# Patient Record
Sex: Male | Born: 1955 | Hispanic: Yes | Marital: Married | Smoking: Former smoker
Health system: Southern US, Community
[De-identification: ages and names within clinical notes are randomized; demographics above are authoritative.]

## PROBLEM LIST (undated history)

## (undated) DIAGNOSIS — H9319 Tinnitus, unspecified ear: Secondary | ICD-10-CM

## (undated) DIAGNOSIS — M109 Gout, unspecified: Secondary | ICD-10-CM

## (undated) DIAGNOSIS — R972 Elevated prostate specific antigen [PSA]: Secondary | ICD-10-CM

## (undated) DIAGNOSIS — E119 Type 2 diabetes mellitus without complications: Secondary | ICD-10-CM

## (undated) HISTORY — DX: Type 2 diabetes mellitus without complications: E11.9

## (undated) HISTORY — DX: Tinnitus, unspecified ear: H93.19

## (undated) HISTORY — DX: Elevated prostate specific antigen (PSA): R97.20

## (undated) HISTORY — DX: Gout, unspecified: M10.9

---

## 2005-08-19 ENCOUNTER — Ambulatory Visit: Admission: RE | Admit: 2005-08-19 | Discharge: 2005-08-19 | Payer: Self-pay | Admitting: Otolaryngology

## 2005-09-10 ENCOUNTER — Emergency Department (HOSPITAL_COMMUNITY): Admission: EM | Admit: 2005-09-10 | Discharge: 2005-09-10 | Payer: Self-pay | Admitting: Emergency Medicine

## 2011-07-17 ENCOUNTER — Encounter: Payer: Self-pay | Admitting: Internal Medicine

## 2011-07-17 ENCOUNTER — Ambulatory Visit (INDEPENDENT_AMBULATORY_CARE_PROVIDER_SITE_OTHER): Payer: Self-pay | Admitting: Internal Medicine

## 2011-07-17 DIAGNOSIS — R300 Dysuria: Secondary | ICD-10-CM

## 2011-07-17 DIAGNOSIS — Z0000 Encounter for general adult medical examination without abnormal findings: Secondary | ICD-10-CM | POA: Insufficient documentation

## 2011-07-17 DIAGNOSIS — Z136 Encounter for screening for cardiovascular disorders: Secondary | ICD-10-CM

## 2011-07-17 DIAGNOSIS — Z23 Encounter for immunization: Secondary | ICD-10-CM

## 2011-07-17 LAB — POCT URINALYSIS DIPSTICK
Bilirubin, UA: NEGATIVE
Glucose, UA: NEGATIVE
Leukocytes, UA: NEGATIVE
Nitrite, UA: NEGATIVE
Protein, UA: NEGATIVE
Spec Grav, UA: 1.03
Urobilinogen, UA: 0.2

## 2011-07-17 LAB — LDL CHOLESTEROL, DIRECT: Direct LDL: 156.7 mg/dL

## 2011-07-17 LAB — COMPREHENSIVE METABOLIC PANEL
ALT: 19 U/L (ref 0–53)
AST: 25 U/L (ref 0–37)
Albumin: 4.2 g/dL (ref 3.5–5.2)
Alkaline Phosphatase: 68 U/L (ref 39–117)
CO2: 30 mEq/L (ref 19–32)
Calcium: 9.2 mg/dL (ref 8.4–10.5)
Chloride: 105 mEq/L (ref 96–112)
Creatinine, Ser: 0.9 mg/dL (ref 0.4–1.5)
Glucose, Bld: 106 mg/dL — ABNORMAL HIGH (ref 70–99)
Potassium: 4.8 mEq/L (ref 3.5–5.1)
Sodium: 141 mEq/L (ref 135–145)
Total Bilirubin: 0.7 mg/dL (ref 0.3–1.2)

## 2011-07-17 LAB — TSH: TSH: 2.24 u[IU]/mL (ref 0.35–5.50)

## 2011-07-17 LAB — CBC WITH DIFFERENTIAL/PLATELET
Basophils Absolute: 0 10*3/uL (ref 0.0–0.1)
Basophils Relative: 0.2 % (ref 0.0–3.0)
Eosinophils Absolute: 0 10*3/uL (ref 0.0–0.7)
Eosinophils Relative: 0.3 % (ref 0.0–5.0)
HCT: 42.9 % (ref 39.0–52.0)
Hemoglobin: 14.1 g/dL (ref 13.0–17.0)
Lymphocytes Relative: 36.6 % (ref 12.0–46.0)
Lymphs Abs: 2.7 10*3/uL (ref 0.7–4.0)
MCHC: 33 g/dL (ref 30.0–36.0)
MCV: 88.2 fl (ref 78.0–100.0)
Monocytes Relative: 7.1 % (ref 3.0–12.0)
Neutro Abs: 4.1 10*3/uL (ref 1.4–7.7)
Neutrophils Relative %: 55.8 % (ref 43.0–77.0)
RDW: 13.9 % (ref 11.5–14.6)

## 2011-07-17 LAB — LIPID PANEL
Cholesterol: 228 mg/dL — ABNORMAL HIGH (ref 0–200)
HDL: 48.5 mg/dL (ref >39.00)
Total CHOL/HDL Ratio: 5
VLDL: 24.2 mg/dL (ref 0.0–40.0)

## 2011-07-17 LAB — PSA: PSA: 2.61 ng/mL (ref 0.10–4.00)

## 2011-07-17 NOTE — Progress Notes (Signed)
  Subjective:    Patient ID: Christopher Guerra, male    DOB: 09-07-1956, 55 y.o.   MRN: [id number]  HPI CPX , new patient X a while has occasional suprapubic pressure and sporadic dysuria No hematuria or nocturia  Past Medical History  Diagnosis Date  . No active medical problems    Past Surgical History  Procedure Date  . No past surgeries    History   Social History  . Marital Status: Unknown    Spouse Name: N/A    Number of Children: 3  . Years of Education: N/A   Occupational History  . works in a Science writer    Social History Main Topics  . Smoking status: Former Games developer  . Smokeless tobacco: Never Used  . Alcohol Use: Yes     socially   . Drug Use: No  . Sexually Active: Not on file   Other Topics Concern  . Not on file   Social History Narrative   Original from [place], 3 daughters, wife has an additional  child    Family History  Problem Relation Age of Onset  . Diabetes      M late onset  . Coronary artery disease Neg Hx   . Stroke Neg Hx   . Prostate cancer Neg Hx   . Colon cancer Neg Hx      Review of Systems No CP-SOB No N-V-D or blood in stools  No anxiety-depression    Objective:   Physical Exam  Constitutional: He is oriented to person, place, and time. He appears well-developed and well-nourished. No distress.  HENT:  Head: Normocephalic and atraumatic.  Neck: No thyromegaly present.       Nl carotid pulse   Cardiovascular: Normal rate, regular rhythm and normal heart sounds.   No murmur heard. Pulmonary/Chest: Effort normal and breath sounds normal. No respiratory distress. He has no wheezes. He has no rales.  Abdominal: Soft. Bowel sounds are normal. He exhibits no distension and no mass. There is no rebound and no guarding.       Mild suprapubic dyscomfort w/ palpation  Genitourinary: Rectum normal and prostate normal. Guaiac negative stool.  Musculoskeletal: He exhibits no edema.  Neurological: He is alert and oriented to  person, place, and time.  Skin: Skin is warm and dry. He is not diaphoretic.  Psychiatric: He has a normal mood and affect. His behavior is normal. Judgment and thought content normal.          Assessment & Plan:

## 2011-07-17 NOTE — Assessment & Plan Note (Signed)
Td 2002, again today Never had a Cscope, benefits discussed, will think about it, iFOB provided Diet-exercise discussed EKG Labs  Some urinary sx, DRE neg: will check a PSA-UA-UCX

## 2011-07-19 LAB — CULTURE, URINE COMPREHENSIVE
Colony Count: NO GROWTH
Organism ID, Bacteria: NO GROWTH

## 2011-07-21 ENCOUNTER — Encounter: Payer: Self-pay | Admitting: Internal Medicine

## 2011-07-29 ENCOUNTER — Encounter: Payer: Self-pay | Admitting: *Deleted

## 2011-07-31 ENCOUNTER — Other Ambulatory Visit: Payer: Self-pay

## 2011-08-01 ENCOUNTER — Other Ambulatory Visit: Payer: Self-pay | Admitting: Internal Medicine

## 2011-08-01 DIAGNOSIS — Z0000 Encounter for general adult medical examination without abnormal findings: Secondary | ICD-10-CM

## 2011-08-04 NOTE — Progress Notes (Signed)
Quick Note:  Printed and mailed ______

## 2011-08-15 ENCOUNTER — Telehealth: Payer: Self-pay | Admitting: Internal Medicine

## 2011-08-15 NOTE — Telephone Encounter (Signed)
Results were mailed on 08/04/11. Will mail them again

## 2012-10-04 ENCOUNTER — Ambulatory Visit (INDEPENDENT_AMBULATORY_CARE_PROVIDER_SITE_OTHER): Payer: Self-pay | Admitting: Internal Medicine

## 2012-10-04 ENCOUNTER — Encounter: Payer: Self-pay | Admitting: Internal Medicine

## 2012-10-04 VITALS — BP 128/70 | HR 64 | Temp 97.9°F | Ht 63.25 in | Wt 164.0 lb

## 2012-10-04 DIAGNOSIS — Z0000 Encounter for general adult medical examination without abnormal findings: Secondary | ICD-10-CM

## 2012-10-04 DIAGNOSIS — R7309 Other abnormal glucose: Secondary | ICD-10-CM

## 2012-10-04 DIAGNOSIS — R739 Hyperglycemia, unspecified: Secondary | ICD-10-CM

## 2012-10-04 DIAGNOSIS — M109 Gout, unspecified: Secondary | ICD-10-CM | POA: Insufficient documentation

## 2012-10-04 LAB — CBC WITH DIFFERENTIAL/PLATELET
Basophils Absolute: 0 10*3/uL (ref 0.0–0.1)
Basophils Relative: 0.3 % (ref 0.0–3.0)
Eosinophils Absolute: 0 10*3/uL (ref 0.0–0.7)
Eosinophils Relative: 0.1 % (ref 0.0–5.0)
HCT: 42.1 % (ref 39.0–52.0)
Hemoglobin: 14.1 g/dL (ref 13.0–17.0)
Lymphocytes Relative: 21.6 % (ref 12.0–46.0)
Lymphs Abs: 2.3 10*3/uL (ref 0.7–4.0)
MCHC: 33.4 g/dL (ref 30.0–36.0)
MCV: 87.2 fl (ref 78.0–100.0)
Monocytes Absolute: 0.6 10*3/uL (ref 0.1–1.0)
Monocytes Relative: 6 % (ref 3.0–12.0)
Neutro Abs: 7.7 10*3/uL (ref 1.4–7.7)
Neutrophils Relative %: 72 % (ref 43.0–77.0)
Platelets: 256 10*3/uL (ref 150.0–400.0)
RBC: 4.83 Mil/uL (ref 4.22–5.81)
WBC: 10.7 10*3/uL — ABNORMAL HIGH (ref 4.5–10.5)

## 2012-10-04 LAB — POCT URINALYSIS DIPSTICK
Ketones, UA: NEGATIVE
Leukocytes, UA: NEGATIVE
Nitrite, UA: NEGATIVE
Spec Grav, UA: 1.01
Urobilinogen, UA: 0.2
pH, UA: 6

## 2012-10-04 LAB — COMPREHENSIVE METABOLIC PANEL
ALT: 24 U/L (ref 0–53)
AST: 23 U/L (ref 0–37)
Alkaline Phosphatase: 81 U/L (ref 39–117)
BUN: 15 mg/dL (ref 6–23)
CO2: 28 mEq/L (ref 19–32)
Calcium: 9.3 mg/dL (ref 8.4–10.5)
Creatinine, Ser: 1 mg/dL (ref 0.4–1.5)
GFR: 83.81 mL/min (ref >60.00)
Glucose, Bld: 108 mg/dL — ABNORMAL HIGH (ref 70–99)
Potassium: 4.4 mEq/L (ref 3.5–5.1)
Sodium: 137 mEq/L (ref 135–145)
Total Bilirubin: 0.7 mg/dL (ref 0.3–1.2)
Total Protein: 7.4 g/dL (ref 6.0–8.3)

## 2012-10-04 LAB — LIPID PANEL
HDL: 50.4 mg/dL (ref >39.00)
Total CHOL/HDL Ratio: 5
VLDL: 21.6 mg/dL (ref 0.0–40.0)

## 2012-10-04 LAB — HEMOGLOBIN A1C: Hgb A1c MFr Bld: 6.5 % (ref 4.6–6.5)

## 2012-10-04 LAB — PSA: PSA: 2.8 ng/mL (ref 0.10–4.00)

## 2012-10-04 LAB — URIC ACID: Uric Acid, Serum: 7.8 mg/dL (ref 4.0–7.8)

## 2012-10-04 LAB — LDL CHOLESTEROL, DIRECT: Direct LDL: 186 mg/dL

## 2012-10-04 NOTE — Progress Notes (Signed)
  Subjective:    Patient ID: Christopher Guerra, male    DOB: 01/20/1956, 56 y.o.   MRN: [id number]  HPI Complete physical exam, in addition we discussed the following: DOT exam On and off pain and swelling in the left or right ankle and dorsum of his foot. Never been diagnosed with gout but he suspects that may be the issue. Current symptoms are in the left ankle, he took some anti-inflammatories yesterday with some relief  Past Medical History  Diagnosis Date  . Gout     ? of gout  . Gout ? 10/04/2012   Past Surgical History  Procedure Date  . No past surgeries    History   Social History  . Marital Status: Married    Spouse Name: N/A    Number of Children: 3  . Years of Education: N/A   Occupational History  . works in a Science writer    Social History Main Topics  . Smoking status: Former Games developer  . Smokeless tobacco: Never Used  . Alcohol Use: Yes     Comment: socially   . Drug Use: No  . Sexually Active: Not on file   Other Topics Concern  . Not on file   Social History Narrative   Original from [place], 3 daughters, wife has an additional child, 2 adopted children ---Lives w/ wife   Family History  Problem Relation Age of Onset  . Diabetes      M late onset  . Coronary artery disease Neg Hx   . Stroke Neg Hx   . Prostate cancer Neg Hx   . Colon cancer Neg Hx     Review of Systems No chest pain, shortness of breath No nausea, vomiting, diarrhea or blood in the stools Occasional dysuria but no difficulty urinating or blood in the urine.     Objective:   Physical Exam General -- alert, well-developed  Neck --no thyromegaly  HEENT -- EOMI, PERRLA Lungs -- normal respiratory effort, no intercostal retractions, no accessory muscle use, and normal breath sounds.   Heart-- normal rate, regular rhythm, no murmur, and no gallop.   Abdomen--soft, non-tender, no distention, no masses, no HSM, no guarding, and no rigidity.   Extremities-- no pretibial edema  bilaterally; right ankle/foot normal, left ankle slightly warm, no deformities or redness, it is full range of motion. Rectal-- No external abnormalities noted. Normal sphincter tone. No rectal masses or tenderness. No stools found Prostate:  Prostate gland firm and smooth, no enlargement, nodularity, tenderness, mass, asymmetry or induration. Neurologic-- alert & oriented X3 , DTRs and strength normal in all extremities. Psych-- Cognition and judgment appear intact. Alert and cooperative with normal attention span and concentration.  not anxious appearing and not depressed appearing.      Assessment & Plan:  In addition to the CPX, we spent more than 15 minutes assisting a new problem (gout)

## 2012-10-04 NOTE — Assessment & Plan Note (Addendum)
Td 2012 Flu shot --declined  Never had a Cscope, benefits discussed,agree to be referred to GI Labs  Some urinary sx today and also last year (dysuria):  last year  PSA-UA-UCX normal. Today Udip showed trace blood, DRE is normal, we'll recheck a UA and urine culture. Consider urology referral   DOT form completed except for vision, he was 20/50 right, left and both thus needs to see the eye doctor; will hold on to the form, he will bring additional paperwork for me complete (the small form to sign) Also, blood sugar last year was a slightly elevated, check A1c.

## 2012-10-04 NOTE — Assessment & Plan Note (Signed)
Symptoms consistent with gout, labs. Further advice with results.

## 2012-10-05 LAB — URINALYSIS, MICROSCOPIC ONLY
Bacteria, UA: NONE SEEN
Casts: NONE SEEN
Squamous Epithelial / HPF: NONE SEEN

## 2012-10-06 LAB — URINE CULTURE
Colony Count: NO GROWTH
Organism ID, Bacteria: NO GROWTH

## 2012-10-07 ENCOUNTER — Encounter: Payer: Self-pay | Admitting: Internal Medicine

## 2012-10-07 ENCOUNTER — Telehealth: Payer: Self-pay | Admitting: Internal Medicine

## 2012-10-07 MED ORDER — COLCHICINE 0.6 MG PO TABS: 0.6000 mg | ORAL_TABLET | Freq: Two times a day (BID) | ORAL | Status: DC | PRN

## 2012-10-07 NOTE — Telephone Encounter (Signed)
Blood sugar and cholesterol moderately elevated. Other labs normal including a normal uric acid. I spoke with the patient, recommend a healthier diet, stay active and lose some weight.  Despite a normal uric acid, his left lower extremity pain still could be gout consequently, will call colcrys 0.6 twice a day #60, 1 refill to be taken when necessary. If he is not improving he needs to let me know. Needs office visit in 3 months. Patient verbalized understanding

## 2012-10-21 ENCOUNTER — Telehealth: Payer: Self-pay | Admitting: *Deleted

## 2012-10-21 NOTE — Telephone Encounter (Signed)
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

## 2012-10-29 ENCOUNTER — Ambulatory Visit (AMBULATORY_SURGERY_CENTER): Payer: Self-pay | Admitting: *Deleted

## 2012-10-29 VITALS — Ht 62.0 in | Wt 165.2 lb

## 2012-10-29 DIAGNOSIS — Z1211 Encounter for screening for malignant neoplasm of colon: Secondary | ICD-10-CM

## 2012-10-29 MED ORDER — MOVIPREP 100 G PO SOLR: 1.0000 | Freq: Once | ORAL | Status: DC

## 2012-10-29 NOTE — Progress Notes (Signed)
Free sample kit of suprep given to patient for prep for colonoscopy scheduled 11/10/12

## 2012-11-10 ENCOUNTER — Ambulatory Visit (AMBULATORY_SURGERY_CENTER): Payer: Self-pay | Admitting: Internal Medicine

## 2012-11-10 ENCOUNTER — Encounter: Payer: Self-pay | Admitting: Internal Medicine

## 2012-11-10 VITALS — BP 114/77 | HR 58 | Temp 97.3°F | Resp 17 | Ht 62.0 in | Wt 165.0 lb

## 2012-11-10 DIAGNOSIS — K5730 Diverticulosis of large intestine without perforation or abscess without bleeding: Secondary | ICD-10-CM

## 2012-11-10 DIAGNOSIS — Z1211 Encounter for screening for malignant neoplasm of colon: Secondary | ICD-10-CM

## 2012-11-10 MED ORDER — SODIUM CHLORIDE 0.9 % IV SOLN: 500.0000 mL | INTRAVENOUS | Status: DC

## 2012-11-10 NOTE — Progress Notes (Signed)
Patient did not experience any of the following events: a burn prior to discharge; a fall within the facility; wrong site/side/patient/procedure/implant event; or a hospital transfer or hospital admission upon discharge from the facility. (G8907) Patient did not have preoperative order for IV antibiotic SSI prophylaxis. (G8918)  

## 2012-11-10 NOTE — Patient Instructions (Addendum)
No polyps or cancer seen! You do have diverticulosis - a common condition that is not usually a problem. Please read the handout provided. Next routine colonoscopy in 10 years (2024).  Thank you for choosing me and [facility].  Iva Boop, MD, FACG    YOU HAD AN ENDOSCOPIC PROCEDURE TODAY AT THE [facility]: Refer to the procedure report that was given to you for any specific questions about what was found during the examination.  If the procedure report does not answer your questions, please call your gastroenterologist to clarify.  If you requested that your care partner not be given the details of your procedure findings, then the procedure report has been included in a sealed envelope for you to review at your convenience later.  YOU SHOULD EXPECT: Some feelings of bloating in the abdomen. Passage of more gas than usual.  Walking can help get rid of the air that was put into your GI tract during the procedure and reduce the bloating. If you had a lower endoscopy (such as a colonoscopy or flexible sigmoidoscopy) you may notice spotting of blood in your stool or on the toilet paper. If you underwent a bowel prep for your procedure, then you may not have a normal bowel movement for a few days.  DIET: Your first meal following the procedure should be a light meal and then it is ok to progress to your normal diet.  A half-sandwich or bowl of soup is an example of a good first meal.  Heavy or fried foods are harder to digest and may make you feel nauseous or bloated.  Likewise meals heavy in dairy and vegetables can cause extra gas to form and this can also increase the bloating.  Drink plenty of fluids but you should avoid alcoholic beverages for 24 hours.  ACTIVITY: Your care partner should take you home directly after the procedure.  You should plan to take it easy, moving slowly for the rest of the day.  You can resume normal activity the day after the procedure  however you should NOT DRIVE or use heavy machinery for 24 hours (because of the sedation medicines used during the test).    SYMPTOMS TO REPORT IMMEDIATELY: A gastroenterologist can be reached at any hour.  During normal business hours, 8:30 AM to 5:00 PM Monday through Friday, call [phone].  After hours and on weekends, please call the GI answering service at [phone] who will take a message and have the physician on call contact you.   Following lower endoscopy (colonoscopy or flexible sigmoidoscopy):  Excessive amounts of blood in the stool  Significant tenderness or worsening of abdominal pains  Swelling of the abdomen that is new, acute  Fever of 100F or higher   FOLLOW UP: If any biopsies were taken you will be contacted by phone or by letter within the next 1-3 weeks.  Call your gastroenterologist if you have not heard about the biopsies in 3 weeks.  Our staff will call the home number listed on your records the next business day following your procedure to check on you and address any questions or concerns that you may have at that time regarding the information given to you following your procedure. This is a courtesy call and so if there is no answer at the home number and we have not heard from you through the emergency physician on call, we will assume that you have returned to your regular daily activities without incident.  SIGNATURES/CONFIDENTIALITY: You and/or your care partner have signed paperwork which will be entered into your electronic medical record.  These signatures attest to the fact that that the information above on your After Visit Summary has been reviewed and is understood.  Full responsibility of the confidentiality of this discharge information lies with you and/or your care-partner.    Diverticulosis information given.  Next colonoscopy 10 years-2014.

## 2012-11-10 NOTE — Op Note (Signed)
[facility] [address] [place], [address]   COLONOSCOPY PROCEDURE REPORT  PATIENT: Christopher Guerra, Christopher Guerra  MR#: [id number] BIRTHDATE: 14-Apr-1956 , 57  yrs. old GENDER: Male ENDOSCOPIST: Iva Boop, MD, [facility] REFERRED [id number] Drue Novel, M.D. PROCEDURE DATE:  11/10/2012 PROCEDURE:   Colonoscopy, diagnostic ASA CLASS:   Class II INDICATIONS:average risk screening. MEDICATIONS: propofol (Diprivan) 200mg  IV, MAC sedation, administered by CRNA, and These medications were titrated to patient response per physician's verbal order  DESCRIPTION OF PROCEDURE:   After the risks benefits and alternatives of the procedure were thoroughly explained, informed consent was obtained.  A digital rectal exam revealed no abnormalities of the rectum, A digital rectal exam revealed the prostate was not enlarged, and A digital rectal exam revealed no prostatic nodules.   The LB [id number]  endoscope was introduced through the anus and advanced to the cecum, which was identified by both the appendix and ileocecal valve. IC valve photo taken but blurred so discarded. No adverse events experienced. The quality of the prep was excellent, using MoviPrep  The instrument was then slowly withdrawn as the colon was fully examined.      COLON FINDINGS: Mild diverticulosis was noted The finding was in the right colon.   The colon mucosa was otherwise normal.  Retroflexed views revealed no abnormalities. The time to cecum=1 minutes 16 seconds.  Withdrawal time=7 minutes 0 seconds.  The scope was withdrawn and the procedure completed. COMPLICATIONS: There were no complications.  ENDOSCOPIC IMPRESSION: 1.   Mild diverticulosis was noted in the right colon 2.   The colon mucosa was otherwise normal w/ excellent prep  RECOMMENDATIONS: Repeat colonoscopy 10 years - 2024   eSigned:  Iva Boop, MD, [facility] 11/10/2012 11:17 AM   cc: Willow Ora, MD and The Patient

## 2012-11-11 ENCOUNTER — Telehealth: Payer: Self-pay | Admitting: *Deleted

## 2012-11-11 NOTE — Telephone Encounter (Signed)
  Follow up Call-  Call back number 11/10/2012  Post procedure Call Back phone  # [phone]  Permission to leave phone message Yes     Patient questions:  Do you have a fever, pain , or abdominal swelling? no Pain Score  0 *  Have you tolerated food without any problems? yes  Have you been able to return to your normal activities? yes  Do you have any questions about your discharge instructions: Diet   no Medications  no Follow up visit  no  Do you have questions or concerns about your Care? no  Actions: * If pain score is 4 or above: No action needed, pain <4.

## 2012-11-27 ENCOUNTER — Other Ambulatory Visit: Payer: Self-pay

## 2013-01-05 ENCOUNTER — Ambulatory Visit: Payer: Self-pay | Admitting: Internal Medicine

## 2013-02-01 ENCOUNTER — Ambulatory Visit: Payer: Self-pay | Admitting: Internal Medicine

## 2013-02-01 DIAGNOSIS — Z0289 Encounter for other administrative examinations: Secondary | ICD-10-CM

## 2013-08-18 ENCOUNTER — Other Ambulatory Visit: Payer: Self-pay

## 2013-10-13 HISTORY — PX: CATARACT EXTRACTION: SUR2

## 2015-01-14 ENCOUNTER — Other Ambulatory Visit: Payer: Self-pay

## 2015-01-14 ENCOUNTER — Emergency Department (HOSPITAL_COMMUNITY): Payer: 59

## 2015-01-14 ENCOUNTER — Emergency Department (HOSPITAL_COMMUNITY)
Admission: EM | Admit: 2015-01-14 | Discharge: 2015-01-14 | Disposition: A | Discharge status: Home / Self Care | Payer: 59 | Attending: Emergency Medicine | Admitting: Emergency Medicine

## 2015-01-14 ENCOUNTER — Encounter (HOSPITAL_COMMUNITY): Payer: Self-pay

## 2015-01-14 DIAGNOSIS — Z87891 Personal history of nicotine dependence: Secondary | ICD-10-CM | POA: Insufficient documentation

## 2015-01-14 DIAGNOSIS — M545 Low back pain, unspecified: Secondary | ICD-10-CM

## 2015-01-14 DIAGNOSIS — M109 Gout, unspecified: Secondary | ICD-10-CM | POA: Diagnosis not present

## 2015-01-14 DIAGNOSIS — R1032 Left lower quadrant pain: Secondary | ICD-10-CM | POA: Insufficient documentation

## 2015-01-14 DIAGNOSIS — R21 Rash and other nonspecific skin eruption: Secondary | ICD-10-CM | POA: Insufficient documentation

## 2015-01-14 DIAGNOSIS — R52 Pain, unspecified: Secondary | ICD-10-CM

## 2015-01-14 DIAGNOSIS — Z791 Long term (current) use of non-steroidal anti-inflammatories (NSAID): Secondary | ICD-10-CM | POA: Insufficient documentation

## 2015-01-14 LAB — URINALYSIS, ROUTINE W REFLEX MICROSCOPIC
BILIRUBIN URINE: NEGATIVE
Glucose, UA: NEGATIVE mg/dL
Hgb urine dipstick: NEGATIVE
KETONES UR: NEGATIVE mg/dL
LEUKOCYTES UA: NEGATIVE
Nitrite: NEGATIVE
PH: 7 (ref 5.0–8.0)
Protein, ur: NEGATIVE mg/dL
Specific Gravity, Urine: 1.016 (ref 1.005–1.030)
UROBILINOGEN UA: 0.2 mg/dL (ref 0.0–1.0)

## 2015-01-14 LAB — BASIC METABOLIC PANEL
Anion gap: 11 (ref 5–15)
BUN: 9 mg/dL (ref 6–23)
CO2: 26 mmol/L (ref 19–32)
Calcium: 9.1 mg/dL (ref 8.4–10.5)
Chloride: 100 mmol/L (ref 96–112)
Creatinine, Ser: 0.95 mg/dL (ref 0.50–1.35)
GFR calc Af Amer: >90 mL/min (ref >90)
GFR, EST NON AFRICAN AMERICAN: 89 mL/min — AB (ref >90)
Glucose, Bld: 134 mg/dL — ABNORMAL HIGH (ref 70–99)
Potassium: 4.1 mmol/L (ref 3.5–5.1)
Sodium: 137 mmol/L (ref 135–145)

## 2015-01-14 LAB — CBC WITH DIFFERENTIAL/PLATELET
Basophils Absolute: 0 10*3/uL (ref 0.0–0.1)
Basophils Relative: 0 % (ref 0–1)
EOS PCT: 2 % (ref 0–5)
Eosinophils Absolute: 0.2 10*3/uL (ref 0.0–0.7)
HCT: 44.4 % (ref 39.0–52.0)
Hemoglobin: 14.9 g/dL (ref 13.0–17.0)
LYMPHS ABS: 3.3 10*3/uL (ref 0.7–4.0)
LYMPHS PCT: 29 % (ref 12–46)
MCH: 28.9 pg (ref 26.0–34.0)
MCHC: 33.6 g/dL (ref 30.0–36.0)
MCV: 86.2 fL (ref 78.0–100.0)
MONOS PCT: 6 % (ref 3–12)
Monocytes Absolute: 0.7 10*3/uL (ref 0.1–1.0)
Neutro Abs: 7 10*3/uL (ref 1.7–7.7)
Neutrophils Relative %: 63 % (ref 43–77)
Platelets: 230 10*3/uL (ref 150–400)
RBC: 5.15 MIL/uL (ref 4.22–5.81)
RDW: 14.4 % (ref 11.5–15.5)
WBC: 11.2 10*3/uL — ABNORMAL HIGH (ref 4.0–10.5)

## 2015-01-14 LAB — TROPONIN I

## 2015-01-14 MED ORDER — SODIUM CHLORIDE 0.9 % IV SOLN
INTRAVENOUS | Status: DC
  Administered 2015-01-14: 20 mL/h via INTRAVENOUS

## 2015-01-14 MED ORDER — HYDROMORPHONE HCL 1 MG/ML IJ SOLN
1.0000 mg | Freq: Once | INTRAMUSCULAR | Status: AC
  Administered 2015-01-14: 1 mg via INTRAVENOUS
  Filled 2015-01-14: qty 1

## 2015-01-14 MED ORDER — LORAZEPAM 2 MG/ML IJ SOLN
1.0000 mg | Freq: Once | INTRAMUSCULAR | Status: AC
  Administered 2015-01-14: 1 mg via INTRAVENOUS
  Filled 2015-01-14: qty 1

## 2015-01-14 MED ORDER — OXYCODONE-ACETAMINOPHEN 5-325 MG PO TABS: 1.0000 | ORAL_TABLET | ORAL | Status: DC | PRN

## 2015-01-14 MED ORDER — DIAZEPAM 2 MG PO TABS: 2.0000 mg | ORAL_TABLET | ORAL | Status: DC | PRN

## 2015-01-14 MED ORDER — ONDANSETRON HCL 4 MG/2ML IJ SOLN
4.0000 mg | Freq: Once | INTRAMUSCULAR | Status: AC
  Administered 2015-01-14: 4 mg via INTRAVENOUS
  Filled 2015-01-14: qty 2

## 2015-01-14 MED ORDER — KETOROLAC TROMETHAMINE 30 MG/ML IJ SOLN
30.0000 mg | Freq: Once | INTRAMUSCULAR | Status: AC
  Administered 2015-01-14: 30 mg via INTRAVENOUS
  Filled 2015-01-14: qty 1

## 2015-01-14 MED ORDER — ONDANSETRON 4 MG PO TBDP
4.0000 mg | ORAL_TABLET | Freq: Once | ORAL | Status: AC
  Administered 2015-01-14: 4 mg via ORAL
  Filled 2015-01-14: qty 1

## 2015-01-14 NOTE — Discharge Instructions (Signed)
Back Pain, Adult Low back pain is very common. About 1 in 5 people have back pain.The cause of low back pain is rarely dangerous. The pain often gets better over time.About half of people with a sudden onset of back pain feel better in just 2 weeks. About 8 in 10 people feel better by 6 weeks.  CAUSES Some common causes of back pain include:  Strain of the muscles or ligaments supporting the spine.  Wear and tear (degeneration) of the spinal discs.  Arthritis.  Direct injury to the back. DIAGNOSIS Most of the time, the direct cause of low back pain is not known.However, back pain can be treated effectively even when the exact cause of the pain is unknown.Answering your caregiver's questions about your overall health and symptoms is one of the most accurate ways to make sure the cause of your pain is not dangerous. If your caregiver needs more information, he or she may order lab work or imaging tests (X-rays or MRIs).However, even if imaging tests show changes in your back, this usually does not require surgery. HOME CARE INSTRUCTIONS For many people, back pain returns.Since low back pain is rarely dangerous, it is often a condition that people can learn to manageon their own.   Remain active. It is stressful on the back to sit or stand in one place. Do not sit, drive, or stand in one place for more than 30 minutes at a time. Take short walks on level surfaces as soon as pain allows.Try to increase the length of time you walk each day.  Do not stay in bed.Resting more than 1 or 2 days can delay your recovery.  Do not avoid exercise or work.Your body is made to move.It is not dangerous to be active, even though your back may hurt.Your back will likely heal faster if you return to being active before your pain is gone.  Pay attention to your body when you bend and lift. Many people have less discomfortwhen lifting if they bend their knees, keep the load close to their bodies,and  avoid twisting. Often, the most comfortable positions are those that put less stress on your recovering back.  Find a comfortable position to sleep. Use a firm mattress and lie on your side with your knees slightly bent. If you lie on your back, put a pillow under your knees.  Only take over-the-counter or prescription medicines as directed by your caregiver. Over-the-counter medicines to reduce pain and inflammation are often the most helpful.Your caregiver may prescribe muscle relaxant drugs.These medicines help dull your pain so you can more quickly return to your normal activities and healthy exercise.  Put ice on the injured area.  Put ice in a plastic bag.  Place a towel between your skin and the bag.  Leave the ice on for 15-20 minutes, 03-04 times a day for the first 2 to 3 days. After that, ice and heat may be alternated to reduce pain and spasms.  Ask your caregiver about trying back exercises and gentle massage. This may be of some benefit.  Avoid feeling anxious or stressed.Stress increases muscle tension and can worsen back pain.It is important to recognize when you are anxious or stressed and learn ways to manage it.Exercise is a great option. SEEK MEDICAL CARE IF:  You have pain that is not relieved with rest or medicine.  You have pain that does not improve in 1 week.  You have new symptoms.  You are generally not feeling well. SEEK   IMMEDIATE MEDICAL CARE IF:   You have pain that radiates from your back into your legs.  You develop new bowel or bladder control problems.  You have unusual weakness or numbness in your arms or legs.  You develop nausea or vomiting.  You develop abdominal pain.  You feel faint. Document Released: 09/29/2005 Document Revised: 03/30/2012 Document Reviewed: 01/31/2014 ExitCare Patient Information 2015 [employer]. This information is not intended to replace advice given to you by your health care provider. Make sure you  discuss any questions you have with your health care provider.  

## 2015-01-14 NOTE — ED Notes (Signed)
Pt reports left sided back pain that radiates to chest. Described as "cramping." Denies SOb, diaphoresis, N/V. Pt also reports itching red rash x 2 weeks that is progressively worsening.

## 2015-01-14 NOTE — ED Provider Notes (Signed)
CSN: [id number]     Arrival date & time 01/14/15  1050 History   First MD Initiated Contact with Patient 01/14/15 1139     Chief Complaint  Patient presents with  . Back Pain  . Rash     (Consider location/radiation/quality/duration/timing/severity/associated sxs/prior Treatment) HPI Comments: Patient here with 3 days of left-sided flank pain that radiates to his left lower quadrant in his left chest. No associated anginal type symptoms. Denies any diaphoresis or dyspnea. No nausea vomiting. Denies any dysuria or hematuria. Symptoms have been persistent and are worse with certain movements and better with rest. Denies any recent history of trauma. Use over-the-counter medication without relief. Denies any recent fever or cough.  Patient is a 59 y.o. male presenting with back pain and rash. The history is provided by the patient.  Back Pain Rash   Past Medical History  Diagnosis Date  . Gout     ? of gout  . Gout ? 10/04/2012   Past Surgical History  Procedure Laterality Date  . No past surgeries     Family History  Problem Relation Age of Onset  . Diabetes      M late onset  . Coronary artery disease Neg Hx   . Stroke Neg Hx   . Prostate cancer Neg Hx   . Colon cancer Neg Hx   . Rectal cancer Neg Hx   . Stomach cancer Neg Hx   . Esophageal cancer Neg Hx    History  Substance Use Topics  . Smoking status: Former Games developermoker  . Smokeless tobacco: Never Used  . Alcohol Use: Yes     Comment: socially     Review of Systems  Musculoskeletal: Positive for back pain.  Skin: Positive for rash.  All other systems reviewed and are negative.     Allergies  Review of patient's allergies indicates no known allergies.  Home Medications   Prior to Admission medications   Medication Sig Start Date End Date Taking? Authorizing Provider  colchicine 0.6 MG tablet Take 1 tablet (0.6 mg total) by mouth 2 (two) times daily as needed. 10/07/12   Wanda PlumpJose E Paz, MD  naproxen sodium  (ANAPROX) 220 MG tablet Take 220 mg by mouth 2 (two) times daily with a meal.    Historical Provider, MD   BP 176/81 mmHg  Pulse 65  Temp(Src) 98 F (36.7 C) (Oral)  Resp 13  SpO2 99% Physical Exam  Constitutional: He is oriented to person, place, and time. He appears well-developed and well-nourished.  Non-toxic appearance. No distress.  HENT:  Head: Normocephalic and atraumatic.  Eyes: Conjunctivae, EOM and lids are normal. Pupils are equal, round, and reactive to light.  Neck: Normal range of motion. Neck supple. No tracheal deviation present. No thyroid mass present.  Cardiovascular: Normal rate, regular rhythm and normal heart sounds.  Exam reveals no gallop.   No murmur heard. Pulmonary/Chest: Effort normal and breath sounds normal. No stridor. No respiratory distress. He has no decreased breath sounds. He has no wheezes. He has no rhonchi. He has no rales.  Abdominal: Soft. Normal appearance and bowel sounds are normal. He exhibits no distension. There is tenderness in the left lower quadrant. There is CVA tenderness. There is no rigidity, no rebound and no guarding. No hernia.  Musculoskeletal: Normal range of motion. He exhibits no edema or tenderness.  Neurological: He is alert and oriented to person, place, and time. He has normal strength. No cranial nerve deficit or sensory deficit. GCS  eye subscore is 4. GCS verbal subscore is 5. GCS motor subscore is 6.  Skin: Skin is warm and dry. No abrasion and no rash noted.  Resolving poison ivy rash noted  Psychiatric: He has a normal mood and affect. His speech is normal and behavior is normal.  Nursing note and vitals reviewed.   ED Course  Procedures (including critical care time) Labs Review Labs Reviewed - No data to display  Imaging Review No results found.   EKG Interpretation None      MDM   Final diagnoses:  Pain  01/14/2015    ED ECG REPORT   Date: 01/14/2015  Rate: 67  Rhythm: normal sinus rhythm  QRS  Axis: normal  Intervals: normal  ST/T Wave abnormalities: normal  Conduction Disutrbances:none  Narrative Interpretation:   Old EKG Reviewed: none available  I have personally reviewed the EKG tracing and agree with the computerized printout as noted.  <ECG>     Patient given pain medication here feels better. Suspect he has musculoskeletal discomfort and he is stable for discharge  Lorre Nick, MD 01/14/15 [phone]

## 2015-01-15 LAB — URINE CULTURE
CULTURE: NO GROWTH
Colony Count: NO GROWTH

## 2015-01-16 ENCOUNTER — Telehealth: Payer: Self-pay | Admitting: Internal Medicine

## 2015-01-16 NOTE — Telephone Encounter (Signed)
Pre visit letter sent  °

## 2015-01-17 ENCOUNTER — Other Ambulatory Visit: Payer: Self-pay

## 2015-02-05 ENCOUNTER — Telehealth: Payer: Self-pay

## 2015-02-05 NOTE — Telephone Encounter (Signed)
See speciality notes 

## 2015-02-06 ENCOUNTER — Encounter: Payer: Self-pay | Admitting: Internal Medicine

## 2015-02-06 ENCOUNTER — Ambulatory Visit (INDEPENDENT_AMBULATORY_CARE_PROVIDER_SITE_OTHER): Payer: 59 | Admitting: Internal Medicine

## 2015-02-06 VITALS — BP 118/66 | HR 74 | Temp 97.9°F | Ht 62.5 in | Wt 165.4 lb

## 2015-02-06 DIAGNOSIS — Z0000 Encounter for general adult medical examination without abnormal findings: Secondary | ICD-10-CM

## 2015-02-06 NOTE — Progress Notes (Signed)
Pre visit review using our clinic review tool, if applicable. No additional management support is needed unless otherwise documented below in the visit note. 

## 2015-02-06 NOTE — Patient Instructions (Signed)
Get your blood work before you leave   Come back to the office in 1 year   for a physical exam  Please schedule an appointment at the front desk    Come back fasting       

## 2015-02-06 NOTE — Assessment & Plan Note (Addendum)
Td 2012   Prostate cancer screening: DRE today unremarkable, check a PSA. Cscope 10-2012 neg, 10 years   Labs   Diet and exercise discussed [facility] a [facility] Will do.

## 2015-02-06 NOTE — Progress Notes (Signed)
Subjective:    Patient ID: Christopher Guerra, male    DOB: 08/13/1956, 59 y.o.   MRN: [id number]  DOS:  02/06/2015 Type of visit - description : cpx Interval history: Last seen approximately 2 years ago, since then he is doing well except for the recent ER visit. Shortly after he drove back from [place] he developed pain located at the anterior chest-epigastrium and radiated laterally to the  left low back. Went to the ER, eval was negative, symptoms quickly resolved and he is asymptomatic. Denies lower extremity edema, palpitations.     Review of Systems  Constitutional: No fever, chills. No unexplained wt changes. No unusual sweats HEENT: No dental problems, ear discharge, facial swelling, voice changes. No eye discharge, redness or intolerance to light Respiratory: No wheezing or difficulty breathing. No cough , mucus production Cardiovascular: No CP, leg swelling or palpitations GI: no nausea, vomiting, diarrhea or abdominal pain.  No blood in the stools. No dysphagia   Endocrine: No polyphagia, polyuria or polydipsia GU: No dysuria, gross hematuria, difficulty urinating. No urinary urgency or frequency. Musculoskeletal: No joint swellings or unusual aches or pains Skin: No change in the color of the skin, palor or rash Allergic, immunologic: No environmental allergies or food allergies Neurological: No dizziness or syncope. No headaches. No diplopia, slurred speech, motor deficits, facial numbness Hematological: No enlarged lymph nodes, easy bruising or bleeding Psychiatry: No suicidal ideas, hallucinations, behavior problems or confusion. No unusual/severe anxiety or depression.    Past Medical History  Diagnosis Date  . Gout     ? of gout  . Tinnitus     Past Surgical History  Procedure Laterality Date  . Cataract extraction Bilateral 2015    History   Social History  . Marital Status: Married    Spouse Name: N/A  . Number of Children: 3  . Years of  Education: N/A   Occupational History  . works indepedently     Social History Main Topics  . Smoking status: Former Games developer  . Smokeless tobacco: Never Used  . Alcohol Use: Yes     Comment: socially   . Drug Use: No  . Sexual Activity: Not on file   Other Topics Concern  . Not on file   Social History Narrative   Original from [place], 3 daughters, wife has an additional child, 2 adopted children    Lives w/ wife     Family History  Problem Relation Age of Onset  . Diabetes      M late onset  . Coronary artery disease Neg Hx   . Stroke Neg Hx   . Prostate cancer Neg Hx   . Colon cancer Neg Hx   . Rectal cancer Neg Hx   . Stomach cancer Neg Hx   . Esophageal cancer Neg Hx        Medication List    Notice  As of 02/06/2015 11:59 PM   You have not been prescribed any medications.         Objective:   Physical Exam BP 118/66 mmHg  Pulse 74  Temp(Src) 97.9 F (36.6 C) (Oral)  Ht 5' 2.5" (1.588 m)  Wt 165 lb 6 oz (75.014 kg)  BMI 29.75 kg/m2  SpO2 99%  General:   Well developed, well nourished . NAD.  Neck:  Full range of motion. Supple. No  Thyromegaly  HEENT:  Normocephalic . Face symmetric, atraumatic Lungs:  CTA B Normal respiratory effort, no intercostal retractions, no accessory  muscle use. Heart: RRR,  no murmur.  Abdomen:  Not distended, soft, non-tender. No rebound or rigidity. No mass,organomegaly Muscle skeletal: no pretibial edema bilaterally  Rectal:  External abnormalities: none. Normal sphincter tone. No rectal masses or tenderness.  No stool found  Prostate: Prostate gland firm and smooth, no enlargement, nodularity, tenderness, mass, asymmetry or induration. Skin: Exposed areas without rash. Not pale. Not jaundice Neurologic:  alert & oriented X3.  Speech normal, gait appropriate for age and unassisted Strength symmetric and appropriate for age.  Psych: Cognition and judgment appear intact.  Cooperative with normal attention  span and concentration.  Behavior appropriate. No anxious or depressed appearing.       Assessment & Plan:

## 2015-02-07 LAB — CBC WITH DIFFERENTIAL/PLATELET
Basophils Absolute: 0.1 10*3/uL (ref 0.0–0.1)
Basophils Relative: 1.2 % (ref 0.0–3.0)
EOS ABS: 0 10*3/uL (ref 0.0–0.7)
Eosinophils Relative: 0.4 % (ref 0.0–5.0)
HCT: 41.5 % (ref 39.0–52.0)
HEMOGLOBIN: 14 g/dL (ref 13.0–17.0)
Lymphocytes Relative: 32.7 % (ref 12.0–46.0)
Lymphs Abs: 2.9 10*3/uL (ref 0.7–4.0)
MCHC: 33.8 g/dL (ref 30.0–36.0)
MCV: 85.5 fl (ref 78.0–100.0)
MONO ABS: 0.7 10*3/uL (ref 0.1–1.0)
Monocytes Relative: 7.6 % (ref 3.0–12.0)
NEUTROS ABS: 5.1 10*3/uL (ref 1.4–7.7)
Neutrophils Relative %: 58.1 % (ref 43.0–77.0)
Platelets: 266 10*3/uL (ref 150.0–400.0)
RBC: 4.85 Mil/uL (ref 4.22–5.81)
RDW: 14.9 % (ref 11.5–15.5)
WBC: 8.8 10*3/uL (ref 4.0–10.5)

## 2015-02-07 LAB — LIPID PANEL
CHOL/HDL RATIO: 5
Cholesterol: 244 mg/dL — ABNORMAL HIGH (ref 0–200)
HDL: 48.8 mg/dL (ref >39.00)
LDL Cholesterol: 167 mg/dL — ABNORMAL HIGH (ref 0–99)
NONHDL: 195.2
Triglycerides: 139 mg/dL (ref 0.0–149.0)
VLDL: 27.8 mg/dL (ref 0.0–40.0)

## 2015-02-07 LAB — PSA: PSA: 3.2 ng/mL (ref 0.10–4.00)

## 2015-02-07 LAB — TSH: TSH: 2.1 u[IU]/mL (ref 0.35–4.50)

## 2015-02-07 LAB — ALT: ALT: 21 U/L (ref 0–53)

## 2015-02-07 LAB — AST: AST: 23 U/L (ref 0–37)

## 2015-03-02 ENCOUNTER — Telehealth: Payer: Self-pay | Admitting: Internal Medicine

## 2015-03-02 NOTE — Telephone Encounter (Signed)
Caller name:Leo Relationship to patient:SELF  Can be reached:[phone] Pharmacy:  Reason for call:WOULD LIKE HIS LAB RESULTS

## 2015-03-02 NOTE — Telephone Encounter (Signed)
Pt was in office, printed latest lab results and given to Pt. Informed him that his lab results were released to his MyChart on 02/09/2015 for him to view. Informed him that labs were normal except for slightly elevated cholesterol, no need for medication at this time to just watch diet and increase exercise. Pt verbalized understanding.

## 2016-09-09 ENCOUNTER — Encounter: Payer: Self-pay | Admitting: Internal Medicine

## 2016-09-09 ENCOUNTER — Ambulatory Visit (INDEPENDENT_AMBULATORY_CARE_PROVIDER_SITE_OTHER): Payer: BLUE CROSS/BLUE SHIELD | Admitting: Internal Medicine

## 2016-09-09 VITALS — BP 126/72 | HR 69 | Temp 97.7°F | Resp 14 | Ht 62.5 in | Wt 168.0 lb

## 2016-09-09 DIAGNOSIS — Z0000 Encounter for general adult medical examination without abnormal findings: Secondary | ICD-10-CM

## 2016-09-09 DIAGNOSIS — E119 Type 2 diabetes mellitus without complications: Secondary | ICD-10-CM | POA: Diagnosis not present

## 2016-09-09 DIAGNOSIS — E785 Hyperlipidemia, unspecified: Secondary | ICD-10-CM | POA: Diagnosis not present

## 2016-09-09 DIAGNOSIS — Z114 Encounter for screening for human immunodeficiency virus [HIV]: Secondary | ICD-10-CM

## 2016-09-09 NOTE — Patient Instructions (Signed)
GO TO THE LAB : Get the blood work     GO TO THE FRONT DESK Schedule your next appointment for a  routine checkup in 6 months  

## 2016-09-09 NOTE — Progress Notes (Signed)
Pre visit review using our clinic review tool, if applicable. No additional management support is needed unless otherwise documented below in the visit note. 

## 2016-09-09 NOTE — Progress Notes (Signed)
Subjective:    Patient ID: Christopher Guerra, male    DOB: Jan 30, 1956, 60 y.o.   MRN: [id number]  DOS:  09/09/2016 Type of visit - description : CPX Interval history: No major concerns   Review of Systems Constitutional: No fever. No chills. No unexplained wt changes. No unusual sweats  HEENT: No dental problems, no ear discharge, no facial swelling, no voice changes. No eye discharge, no eye  redness , no  intolerance to light   Respiratory: No wheezing , no  difficulty breathing. No cough , no mucus production  Cardiovascular: No CP, no leg swelling , no  Palpitations  GI: no nausea, no vomiting, no diarrhea , no  abdominal pain.  No blood in the stools. No dysphagia, no odynophagia    Endocrine: No polyphagia, no polyuria , no polydipsia  GU: No dysuria, gross hematuria, difficulty urinating. No urinary urgency, no frequency.  Musculoskeletal: No joint swellings or unusual aches or pains  Skin: No change in the color of the skin, palor , no  Rash  Allergic, immunologic: No environmental allergies , no  food allergies  Neurological: No dizziness no  syncope. No headaches. No diplopia, no slurred, no slurred speech, no motor deficits, no facial  Numbness  Hematological: No enlarged lymph nodes, no easy bruising , no unusual bleedings  Psychiatry: No suicidal ideas, no hallucinations, no beavior problems, no confusion.  No unusual/severe anxiety, no depression   Past Medical History:  Diagnosis Date  . Gout    ? of gout  . Tinnitus     Past Surgical History:  Procedure Laterality Date  . CATARACT EXTRACTION Bilateral 2015    Social History   Social History  . Marital status: Married    Spouse name: N/A  . Number of children: 3  . Years of education: N/A   Occupational History  . works indepedently     Social History Main Topics  . Smoking status: Former Games developermoker  . Smokeless tobacco: Never Used  . Alcohol use Yes     Comment: socially   . Drug  use: No  . Sexual activity: Not on file   Other Topics Concern  . Not on file   Social History Narrative   Original from [place], 3 daughters, wife has an additional child, 2 adopted children    Lives w/ wife     Family History  Problem Relation Age of Onset  . Diabetes      M late onset  . Coronary artery disease Neg Hx   . Stroke Neg Hx   . Prostate cancer Neg Hx   . Colon cancer Neg Hx   . Rectal cancer Neg Hx   . Stomach cancer Neg Hx   . Esophageal cancer Neg Hx        Medication List    as of 09/09/2016  3:44 PM   You have not been prescribed any medications.        Objective:   Physical Exam BP 126/72 (BP Location: Right Arm, Patient Position: Sitting, Cuff Size: Normal)   Pulse 69   Temp 97.7 F (36.5 C) (Oral)   Resp 14   Ht 5' 2.5" (1.588 m)   Wt 168 lb (76.2 kg)   SpO2 97%   BMI 30.24 kg/m   General:   Well developed, well nourished . NAD.  Neck: No  thyromegaly  HEENT:  Normocephalic . Face symmetric, atraumatic Lungs:  CTA B Normal respiratory effort, no intercostal retractions, no  accessory muscle use. Heart: RRR,  no murmur.  No pretibial edema bilaterally  Abdomen:  Not distended, soft, non-tender. No rebound or rigidity. Rectal:  External abnormalities: none. Normal sphincter tone. No rectal masses or tenderness.  No stools found Prostate: Prostate gland firm and smooth, no enlargement, nodularity, tenderness, mass, asymmetry or induration.  Skin: Exposed areas without rash. Not pale. Not jaundice Neurologic:  alert & oriented X3.  Speech normal, gait appropriate for age and unassisted Strength symmetric and appropriate for age.  Psych: Cognition and judgment appear intact.  Cooperative with normal attention span and concentration.  Behavior appropriate. No anxious or depressed appearing.    Assessment & Plan:   Assessment DM: A1c 6.5   (2013) H/o Gout  H/o  Mild Tinnitus , reports he saw ENT before  Cataract surgery  B  PLAN: DM: Last A1c was 6.5, patient aware of the dx, diet and exercise discussed. Recheck in a A1c and micro RTC 6 months

## 2016-09-09 NOTE — Assessment & Plan Note (Addendum)
Td 2012; flu shot declined it. Pneumonia shot declined. Benefits discussed   Prostate cancer screening: PSA was 3.2, repeated a DRE today negative, recheck a PSA Cscope 10-2012 neg, 10 years   Labs  : BMP, FLP, PSA, A1c, microalbumin-HIV Diet and exercise discussed

## 2016-09-10 LAB — HIV ANTIBODY (ROUTINE TESTING W REFLEX): HIV: NONREACTIVE

## 2016-09-10 LAB — LIPID PANEL
CHOLESTEROL: 254 mg/dL — AB (ref 0–200)
HDL: 47.7 mg/dL (ref >39.00)
LDL CALC: 178 mg/dL — AB (ref 0–99)
NonHDL: 206.71
TRIGLYCERIDES: 143 mg/dL (ref 0.0–149.0)
Total CHOL/HDL Ratio: 5
VLDL: 28.6 mg/dL (ref 0.0–40.0)

## 2016-09-10 LAB — PSA: PSA: 4.74 ng/mL — ABNORMAL HIGH (ref 0.10–4.00)

## 2016-09-10 LAB — MICROALBUMIN / CREATININE URINE RATIO
CREATININE, U: 150.4 mg/dL
MICROALB/CREAT RATIO: 0.7 mg/g (ref 0.0–30.0)
Microalb, Ur: 1.1 mg/dL (ref 0.0–1.9)

## 2016-09-10 LAB — BASIC METABOLIC PANEL
BUN: 18 mg/dL (ref 6–23)
CO2: 28 mEq/L (ref 19–32)
Calcium: 9.4 mg/dL (ref 8.4–10.5)
Chloride: 100 mEq/L (ref 96–112)
Creatinine, Ser: 0.94 mg/dL (ref 0.40–1.50)
GFR: 86.76 mL/min (ref >60.00)
Glucose, Bld: 91 mg/dL (ref 70–99)
POTASSIUM: 4.2 meq/L (ref 3.5–5.1)
SODIUM: 138 meq/L (ref 135–145)

## 2016-09-10 LAB — HEMOGLOBIN A1C: Hgb A1c MFr Bld: 6.2 % (ref 4.6–6.5)

## 2016-09-11 DIAGNOSIS — Z09 Encounter for follow-up examination after completed treatment for conditions other than malignant neoplasm: Secondary | ICD-10-CM | POA: Insufficient documentation

## 2016-09-11 DIAGNOSIS — E119 Type 2 diabetes mellitus without complications: Secondary | ICD-10-CM | POA: Insufficient documentation

## 2016-09-11 NOTE — Assessment & Plan Note (Signed)
DM: Last A1c was 6.5, patient aware of the dx, diet and exercise discussed. Recheck in a A1c and micro RTC 6 months

## 2016-09-15 MED ORDER — ATORVASTATIN CALCIUM 20 MG PO TABS: 20.0000 mg | ORAL_TABLET | Freq: Every day | ORAL | 3 refills | Status: DC

## 2016-09-15 NOTE — Addendum Note (Signed)
Addended byConrad [place]: Gemma Ruan D on: 09/15/2016 09:02 AM   Modules accepted: Orders

## 2016-10-29 ENCOUNTER — Encounter: Payer: Self-pay | Admitting: Internal Medicine

## 2016-12-09 ENCOUNTER — Other Ambulatory Visit (INDEPENDENT_AMBULATORY_CARE_PROVIDER_SITE_OTHER): Payer: BLUE CROSS/BLUE SHIELD

## 2016-12-09 DIAGNOSIS — E785 Hyperlipidemia, unspecified: Secondary | ICD-10-CM | POA: Diagnosis not present

## 2016-12-09 LAB — LIPID PANEL
CHOLESTEROL: 139 mg/dL (ref 0–200)
HDL: 50.3 mg/dL (ref >39.00)
LDL Cholesterol: 66 mg/dL (ref 0–99)
NonHDL: 88.95
Total CHOL/HDL Ratio: 3
Triglycerides: 113 mg/dL (ref 0.0–149.0)
VLDL: 22.6 mg/dL (ref 0.0–40.0)

## 2016-12-09 LAB — ALT: ALT: 24 U/L (ref 0–53)

## 2016-12-09 LAB — AST: AST: 25 U/L (ref 0–37)

## 2016-12-11 MED ORDER — ATORVASTATIN CALCIUM 20 MG PO TABS: 20.0000 mg | ORAL_TABLET | Freq: Every day | ORAL | 6 refills | Status: DC

## 2017-02-26 ENCOUNTER — Encounter (HOSPITAL_BASED_OUTPATIENT_CLINIC_OR_DEPARTMENT_OTHER): Payer: Self-pay

## 2017-02-26 ENCOUNTER — Emergency Department (HOSPITAL_BASED_OUTPATIENT_CLINIC_OR_DEPARTMENT_OTHER)
Admission: EM | Admit: 2017-02-26 | Discharge: 2017-02-26 | Disposition: A | Discharge status: Home / Self Care | Payer: BLUE CROSS/BLUE SHIELD | Attending: Emergency Medicine | Admitting: Emergency Medicine

## 2017-02-26 DIAGNOSIS — E119 Type 2 diabetes mellitus without complications: Secondary | ICD-10-CM | POA: Diagnosis not present

## 2017-02-26 DIAGNOSIS — L739 Follicular disorder, unspecified: Secondary | ICD-10-CM | POA: Diagnosis not present

## 2017-02-26 DIAGNOSIS — Z87891 Personal history of nicotine dependence: Secondary | ICD-10-CM | POA: Insufficient documentation

## 2017-02-26 DIAGNOSIS — Z79899 Other long term (current) drug therapy: Secondary | ICD-10-CM | POA: Insufficient documentation

## 2017-02-26 DIAGNOSIS — R030 Elevated blood-pressure reading, without diagnosis of hypertension: Secondary | ICD-10-CM | POA: Insufficient documentation

## 2017-02-26 DIAGNOSIS — R05 Cough: Secondary | ICD-10-CM | POA: Diagnosis not present

## 2017-02-26 MED ORDER — BENZONATATE 100 MG PO CAPS: 100.0000 mg | ORAL_CAPSULE | Freq: Three times a day (TID) | ORAL | 0 refills | Status: DC | PRN

## 2017-02-26 MED ORDER — CEPHALEXIN 500 MG PO CAPS: ORAL_CAPSULE | ORAL | 0 refills | Status: DC

## 2017-02-26 MED FILL — BENZONATATE 100 MG CAP: 100 | 7 days supply | Qty: 21 | Fill #0

## 2017-02-26 MED FILL — CEPHALEXIN 500 MG CAPSULE: 500 | 7 days supply | Qty: 21 | Fill #0

## 2017-02-26 NOTE — ED Provider Notes (Signed)
[facility]   CSN: [id number] Arrival date & time: 02/26/17  1045     History   Chief Complaint Chief Complaint  Patient presents with  . Insect Bite    HPI Christopher Guerra is a 61 y.o. male.Patient complains of painful bumps to his occipital scalp which she first noticed a week ago. Pain is mild and worse with pressing on the area and improved after treatment with ibuprofen. He denies fever denies vomiting. Other associated symptoms include mild dry cough for one week. No shortness of breath no other associated symptoms.  HPI  Past Medical History:  Diagnosis Date  . Gout    ? of gout  . Tinnitus    Hypercholesterolemia Patient Active Problem List   Diagnosis Date Noted  . Diabetes (HCC) 09/11/2016  . PCP NOTES >>>>>>>>>>> 09/11/2016  . Gout ? 10/04/2012  . Annual physical exam 07/17/2011    Past Surgical History:  Procedure Laterality Date  . CATARACT EXTRACTION Bilateral 2015       Home Medications    Prior to Admission medications   Medication Sig Start Date End Date Taking? Authorizing Provider  atorvastatin (LIPITOR) 20 MG tablet Take 1 tablet (20 mg total) by mouth at bedtime. 12/11/16  Yes Paz, Nolon Rod, MD  ibuprofen (ADVIL,MOTRIN) 200 MG tablet Take 200 mg by mouth every 6 (six) hours as needed.   Yes [provider]  benzonatate (TESSALON) 100 MG capsule Take 1 capsule (100 mg total) by mouth 3 (three) times daily as needed for cough. 02/26/17   Doug Sou, MD  cephALEXin (KEFLEX) 500 MG capsule 1 capsule 3 times daily for 7 days 02/26/17   Doug Sou, MD    Family History Family History  Problem Relation Age of Onset  . Diabetes Unknown        M late onset  . Coronary artery disease Neg Hx   . Stroke Neg Hx   . Prostate cancer Neg Hx   . Colon cancer Neg Hx   . Rectal cancer Neg Hx   . Stomach cancer Neg Hx   . Esophageal cancer Neg Hx     Social History Social History  Substance Use Topics    . Smoking status: Former Games developer  . Smokeless tobacco: Never Used  . Alcohol use Yes     Comment: socially      Allergies   Patient has no known allergies.   Review of Systems Review of Systems  Constitutional: Negative.   HENT: Negative.   Respiratory: Positive for cough.   Cardiovascular: Positive for chest pain.       Syncope  Gastrointestinal: Negative.   Musculoskeletal: Negative.   Skin: Positive for rash.       Painful scalp  Allergic/Immunologic: Negative.   Neurological: Negative.   Psychiatric/Behavioral: Negative.   All other systems reviewed and are negative.    Physical Exam Updated Vital Signs BP (!) 142/91 (BP Location: Right Arm)   Pulse 65   Temp 98.8 F (37.1 C) (Oral)   Resp 18   Ht 5\' 2"  (1.575 m)   Wt 165 lb (74.8 kg)   SpO2 98%   BMI 30.18 kg/m   Physical Exam  Constitutional: He appears well-developed and well-nourished.  HENT:  Head: Normocephalic and atraumatic.  Occipital scalp with multiple tender areas reddened, at hair follicles consistent with folliculitis  Eyes: Conjunctivae are normal. Pupils are equal, round, and reactive to light.  Neck: Neck supple. No tracheal deviation  present. No thyromegaly present.  Single tender posterior cervical node on right side  Cardiovascular: Normal rate, regular rhythm and normal heart sounds.   No murmur heard. Pulmonary/Chest: Effort normal and breath sounds normal.  Abdominal: Soft. Bowel sounds are normal. He exhibits no distension. There is no tenderness.  Musculoskeletal: Normal range of motion. He exhibits no edema or tenderness.  Lymphadenopathy:    He has cervical adenopathy.  Neurological: He is alert. Coordination normal.  Skin: Skin is warm and dry. No rash noted.  Psychiatric: He has a normal mood and affect.  Nursing note and vitals reviewed.    ED Treatments / Results  Labs (all labs ordered are listed, but only abnormal results are displayed) Labs Reviewed - No data to  display  EKG  EKG Interpretation None       Radiology No results found.  Procedures Procedures (including critical care time)  Medications Ordered in ED Medications - No data to display   Initial Impression / Assessment and Plan / ED Course  I have reviewed the triage vital signs and the nursing notes.  Pertinent labs & imaging results that were available during my care of the patient were reviewed by me and considered in my medical decision making (see chart for details).     Plan prescriptions Keflex, Tessalon Perles. Keep scheduled appointment with Dr.Paz 03/10/2017. Sooner if not improving for 5 days. Return if condition worsens. Blood pressure recheck Final Clinical Impressions(s) / ED Diagnoses  Diagnosis #1 folliculitis #2 cough #3 elevated blood pressure Final diagnoses:  Folliculitis    New Prescriptions New Prescriptions   BENZONATATE (TESSALON) 100 MG CAPSULE    Take 1 capsule (100 mg total) by mouth 3 (three) times daily as needed for cough.   CEPHALEXIN (KEFLEX) 500 MG CAPSULE    1 capsule 3 times daily for 7 days     Doug SouJacubowitz, [facility], MD 02/26/17 1157

## 2017-02-26 NOTE — ED Notes (Signed)
ED Provider at bedside. 

## 2017-02-26 NOTE — ED Triage Notes (Signed)
Possible insect bite to back of head x 1 wk.  [place] was in [place] last week might have occurred then. Motrin works but pain comes back.

## 2017-02-26 NOTE — Discharge Instructions (Signed)
Keep your scheduled appointment with Dr.Paz on 03/10/2017. Stand under the warm shower 4 times daily for 30 minutes at a time. Take the antibiotic as prescribed. Call to see Dr.Paz sooner or return if you're not improving in 4 or 5 days. Return for vomiting, fever or if you feel worse for any reason. Ask Dr Drue NovelPaz to recheck your blood pressure when you see him at your next visit. Today's was mildly elevated at 142/91

## 2017-03-10 ENCOUNTER — Ambulatory Visit (INDEPENDENT_AMBULATORY_CARE_PROVIDER_SITE_OTHER): Payer: BLUE CROSS/BLUE SHIELD | Admitting: Internal Medicine

## 2017-03-10 ENCOUNTER — Encounter: Payer: Self-pay | Admitting: Internal Medicine

## 2017-03-10 VITALS — BP 122/64 | HR 58 | Temp 97.6°F | Resp 12 | Ht 62.0 in | Wt 169.4 lb

## 2017-03-10 DIAGNOSIS — Z1159 Encounter for screening for other viral diseases: Secondary | ICD-10-CM

## 2017-03-10 DIAGNOSIS — R972 Elevated prostate specific antigen [PSA]: Secondary | ICD-10-CM | POA: Diagnosis not present

## 2017-03-10 DIAGNOSIS — E119 Type 2 diabetes mellitus without complications: Secondary | ICD-10-CM

## 2017-03-10 LAB — URINALYSIS, ROUTINE W REFLEX MICROSCOPIC
BILIRUBIN URINE: NEGATIVE
Ketones, ur: NEGATIVE
LEUKOCYTES UA: NEGATIVE
NITRITE: NEGATIVE
Specific Gravity, Urine: 1.02 (ref 1.000–1.030)
TOTAL PROTEIN, URINE-UPE24: NEGATIVE
UROBILINOGEN UA: 0.2 (ref 0.0–1.0)
Urine Glucose: NEGATIVE
pH: 6 (ref 5.0–8.0)

## 2017-03-10 LAB — HEMOGLOBIN A1C: Hgb A1c MFr Bld: 6.6 % — ABNORMAL HIGH (ref 4.6–6.5)

## 2017-03-10 LAB — PSA: PSA: 5.85 ng/mL — ABNORMAL HIGH (ref 0.10–4.00)

## 2017-03-10 LAB — HEPATITIS C ANTIBODY: HCV Ab: NEGATIVE

## 2017-03-10 NOTE — Assessment & Plan Note (Signed)
DM: Diet control, check A1c Folliculitis, scalp: dx @  the ER few weeks ago, antibiotics were prescribed, better. has a mild induration where he had the folliculitis, likely a small scar. Observation. Elevated PSA: Last PSA was elevated, he is asx. DRE 08-2016 and today normal. Will check a PSA, UA urine culture. Further advice would result. Hep C screening today RTC 08-2017, CPX

## 2017-03-10 NOTE — Progress Notes (Signed)
Pre visit review using our clinic review tool, if applicable. No additional management support is needed unless otherwise documented below in the visit note. 

## 2017-03-10 NOTE — Patient Instructions (Signed)
GO TO THE LAB : Get the blood work     GO TO THE FRONT DESK Schedule your next appointment for a  Physical exam 08-2017

## 2017-03-10 NOTE — Progress Notes (Signed)
Subjective:    Patient ID: Christopher Guerra, male    DOB: Dec 14, 1955, 61 y.o.   MRN: [id number]  DOS:  03/10/2017 Type of visit - description : Routine office visit Interval history: Diabetes: Due for A1c The last time we checked a PSA it was elevated, he remains asymptomatic. Went to the ER with cough and folliculitis: Status post antibiotics, feeling better, area of the folliculitis (scalp) feels slightly irritated.   Review of Systems No fever chills No chest pain and difficulty breathing No dysuria, gross hematuria, difficulty urinating. No nocturia  Past Medical History:  Diagnosis Date  . Diabetes mellitus without complication (HCC)   . Gout    ? of gout  . Tinnitus     Past Surgical History:  Procedure Laterality Date  . CATARACT EXTRACTION Bilateral 2015    Social History   Social History  . Marital status: Married    Spouse name: N/A  . Number of children: 3  . Years of education: N/A   Occupational History  . works Clinical cytogeneticistindepedently , part time    Social History Main Topics  . Smoking status: Former Games developermoker  . Smokeless tobacco: Never Used  . Alcohol use Yes     Comment: socially   . Drug use: No  . Sexual activity: Not on file   Other Topics Concern  . Not on file   Social History Narrative   Original from [place], 3 daughters, wife has an additional child, 2 adopted children    Lives w/ wife      Allergies as of 03/10/2017   No Known Allergies     Medication List       Accurate as of 03/10/17  3:16 PM. Always use your most recent med list.          atorvastatin 20 MG tablet Commonly known as:  LIPITOR Take 1 tablet (20 mg total) by mouth at bedtime.          Objective:   Physical Exam BP 122/64 (BP Location: Left Arm, Patient Position: Sitting, Cuff Size: Normal)   Pulse (!) 58   Temp 97.6 F (36.4 C) (Oral)   Resp 12   Ht 5\' 2"  (1.575 m)   Wt 169 lb 6 oz (76.8 kg)   SpO2 97%   BMI 30.98 kg/m  General:   Well  developed, well nourished . NAD.  HEENT:  Normocephalic . Face symmetric, atraumatic Lungs:  CTA B Normal respiratory effort, no intercostal retractions, no accessory muscle use. Heart: RRR,  no murmur.  No pretibial edema bilaterally  Skin: Not pale. Not jaundice. Scalp at the area where he had folliculitis is normal to inspection, mild induration where he had the infection. No frequency. Rectal:  External abnormalities: none. Normal sphincter tone. No rectal masses or tenderness.  No stools found Prostate: Prostate gland firm and smooth, no enlargement, nodularity, tenderness, mass, asymmetry or induration.  Neurologic:  alert & oriented X3.  Speech normal, gait appropriate for age and unassisted Psych--  Cognition and judgment appear intact.  Cooperative with normal attention span and concentration.  Behavior appropriate. No anxious or depressed appearing.      Assessment & Plan:   Assessment DM: A1c 6.5   (2013) H/o Gout  H/o  Mild Tinnitus , reports he saw ENT before  Cataract surgery B  PLAN: DM: Diet control, check A1c Folliculitis, scalp: dx @  the ER few weeks ago, antibiotics were prescribed, better. has a mild induration where he  had the folliculitis, likely a small scar. Observation. Elevated PSA: Last PSA was elevated, he is asx. DRE 08-2016 and today normal. Will check a PSA, UA urine culture. Further advice would result. Hep C screening today RTC 08-2017, CPX

## 2017-03-11 LAB — URINE CULTURE

## 2017-03-12 NOTE — Addendum Note (Signed)
Addended byConrad [place]ana[place]: Charliee Krenz D on: 03/12/2017 03:34 PM   Modules accepted: Orders

## 2017-06-01 DIAGNOSIS — R972 Elevated prostate specific antigen [PSA]: Secondary | ICD-10-CM | POA: Diagnosis not present

## 2017-09-01 DIAGNOSIS — R972 Elevated prostate specific antigen [PSA]: Secondary | ICD-10-CM | POA: Diagnosis not present

## 2017-09-08 DIAGNOSIS — N5201 Erectile dysfunction due to arterial insufficiency: Secondary | ICD-10-CM | POA: Diagnosis not present

## 2017-09-08 DIAGNOSIS — R972 Elevated prostate specific antigen [PSA]: Secondary | ICD-10-CM | POA: Diagnosis not present

## 2017-09-08 DIAGNOSIS — R3914 Feeling of incomplete bladder emptying: Secondary | ICD-10-CM | POA: Diagnosis not present

## 2018-08-19 DIAGNOSIS — N5201 Erectile dysfunction due to arterial insufficiency: Secondary | ICD-10-CM | POA: Diagnosis not present

## 2018-08-19 DIAGNOSIS — N3943 Post-void dribbling: Secondary | ICD-10-CM | POA: Diagnosis not present

## 2018-08-19 DIAGNOSIS — R972 Elevated prostate specific antigen [PSA]: Secondary | ICD-10-CM | POA: Diagnosis not present

## 2019-02-07 ENCOUNTER — Ambulatory Visit: Payer: Self-pay

## 2019-02-07 NOTE — Telephone Encounter (Signed)
Pt daughter called for her father to report that her dad has had a dry cough for 2 weeks.  Today his symptoms have worsened with fever and body aches, chills. His temperature 100.5. He has no chest pain but the aching travels to his back. Per the daughter her Kateri Mc came to visit a couple weeks ago from [place]. He has a dry cough but has not had other symptoms. Per protocol home care advice was read to daughter. His daughter Tyron Russell verbalized understanding of all instructions. Call was transferred to office for appointment. E-mail verified.  Reason for Disposition . 1] COVID-19 infection diagnosed or suspected AND [2] mild symptoms (fever, cough) AND [2] no trouble breathing or other complications  Answer Assessment - Initial Assessment Questions 1. COVID-19 DIAGNOSIS: "Who made your Coronavirus (COVID-19) diagnosis?" "Was it confirmed by a positive lab test?" If not diagnosed by a HCP, ask "Are there lots of cases (community spread) where you live?" (See public health department website, if unsure)   * MAJOR community spread: high number of cases; numbers of cases are increasing; many people hospitalized.   * MINOR community spread: low number of cases; not increasing; few or no people hospitalized     [employer], uncle visit from [place] 2. ONSET: "When did the COVID-19 symptoms start?"     2week for cough fever today 3. WORST SYMPTOM: "What is your worst symptom?" (e.g., cough, fever, shortness of breath, muscle aches)     Muscle aches and chills 4. COUGH: "How bad is the cough?"       ocasionally 5. FEVER: "Do you have a fever?" If so, ask: "What is your temperature, how was it measured, and when did it start?"     100.5 6. RESPIRATORY STATUS: "Describe your breathing?" (e.g., shortness of breath, wheezing, unable to speak)      no 7. BETTER-SAME-WORSE: "Are you getting better, staying the same or getting worse compared to yesterday?"  If getting worse, ask, "In what way?"     Worse because of  fever 8. HIGH RISK DISEASE: "Do you have any chronic medical problems?" (e.g., asthma, heart or lung disease, weak immune system, etc.)     no 9. PREGNANCY: "Is there any chance you are pregnant?" "When was your last menstrual period?"    N/A 10. OTHER SYMPTOMS: "Do you have any other symptoms?"  (e.g., runny nose, headache, sore throat, loss of smell)      no  Protocols used: CORONAVIRUS (COVID-19) DIAGNOSED OR SUSPECTED-A-AH

## 2019-02-08 ENCOUNTER — Other Ambulatory Visit: Payer: Self-pay

## 2019-02-08 ENCOUNTER — Ambulatory Visit (INDEPENDENT_AMBULATORY_CARE_PROVIDER_SITE_OTHER): Payer: BLUE CROSS/BLUE SHIELD | Admitting: Internal Medicine

## 2019-02-08 ENCOUNTER — Encounter: Payer: Self-pay | Admitting: Internal Medicine

## 2019-02-08 DIAGNOSIS — R509 Fever, unspecified: Secondary | ICD-10-CM | POA: Diagnosis not present

## 2019-02-08 DIAGNOSIS — R972 Elevated prostate specific antigen [PSA]: Secondary | ICD-10-CM

## 2019-02-08 NOTE — Progress Notes (Signed)
Subjective:    Patient ID: Christopher Guerra, male    DOB: [birth year], 63 y.o.   MRN: [id number]  DOS:  02/08/2019 Type of visit - description:  Attempted  to make this a video visit, due to technical difficulties from the patient side it was not possible  thus we proceeded with a Virtual Visit via Telephone    I connected with@ on 02/08/19 at  9:20 AM EDT by telephone and verified that I am speaking with the correct person using two identifiers.  THIS ENCOUNTER IS A VIRTUAL VISIT DUE TO COVID-19 - PATIENT WAS NOT SEEN IN THE OFFICE. PATIENT HAS CONSENTED TO VIRTUAL VISIT / TELEMEDICINE VISIT   Location of patient: home  Location of provider: office  I discussed the limitations, risks, security and privacy concerns of performing an evaluation and management service by telephone and the availability of in person appointments. I also discussed with the patient that there may be a patient responsible charge related to this service. The patient expressed understanding and agreed to proceed.   History of Present Illness: Acute visit Was doing okay until yesterday when he went out to do some yard work, he came back and he had a backache and some shaking. His wife checked the temperature: 102.0.  Apparently he took over-the-counter medication (ibuprofen, Tylenol?)  Later that night his temperature normalized. Today he feels better, temperature was checked: No fever.  Back pain has improved.    Review of Systems Denies chest pain no difficulty breathing No nausea, vomiting, diarrhea. No rash Occasional cough, at baseline. COVID-19: Is trying to stay safe, when he goes shopping uses a mask, no contact with any well-known covid case that he can tell.  Past Medical History:  Diagnosis Date  . Diabetes mellitus without complication (HCC)   . Elevated PSA   . Gout    ? of gout  . Tinnitus     Past Surgical History:  Procedure Laterality Date  . CATARACT EXTRACTION Bilateral 2015    Social History   Socioeconomic History  . Marital status: Married    Spouse name: Not on file  . Number of children: 3  . Years of education: Not on file  . Highest education level: Not on file  Occupational History  . Occupation: works Clinical cytogeneticist , part time  Social Needs  . Financial resource strain: Not on file  . Food insecurity:    Worry: Not on file    Inability: Not on file  . Transportation needs:    Medical: Not on file    Non-medical: Not on file  Tobacco Use  . Smoking status: Former Games developer  . Smokeless tobacco: Never Used  Substance and Sexual Activity  . Alcohol use: Yes    Comment: socially   . Drug use: No  . Sexual activity: Not on file  Lifestyle  . Physical activity:    Days per week: Not on file    Minutes per session: Not on file  . Stress: Not on file  Relationships  . Social connections:    Talks on phone: Not on file    Gets together: Not on file    Attends religious service: Not on file    Active member of club or organization: Not on file    Attends meetings of clubs or organizations: Not on file    Relationship status: Not on file  . Intimate partner violence:    Fear of current or ex partner: Not on file  Emotionally abused: Not on file    Physically abused: Not on file    Forced sexual activity: Not on file  Other Topics Concern  . Not on file  Social History Narrative   [place] from [place], 3 daughters, wife has an additional child, 2 adopted children    Lives w/ wife      Allergies as of 02/08/2019   No Known Allergies     Medication List       Accurate as of February 08, 2019  9:32 AM. Always use your most recent med list.        atorvastatin 20 MG tablet Commonly known as:  LIPITOR Take 1 tablet (20 mg total) by mouth at bedtime.           Objective:   Physical Exam There were no vitals taken for this visit. This is a phone evaluation, alert oriented x3, in no distress    Assessment     Assessment  DM: A1c 6.5   (2013) H/o Gout  H/o  Mild Tinnitus , reports he saw ENT before  Cataract surgery B  PLAN: Fever: As described above, in the midst of the COVID-19 bandemia, this could be COVID.  Recommend isolation for 1 week and  3 days without fever.  He lives with his wife grandchildren, recommend extreme precautions while at home including using a mask and monitor his family closely. If he gets worse >>> High fever, chest pain, cough, malaise then  needs to call or go to the ER. He verbalized understanding. Other issues: Increased PSA: Last visit with urology that I can tell was in 2018, was recommended tamsulosin and they were considering biopsy. Recommend to come back at his earliest convenience once  the situation with COVID-19 improves, he needs labs & likely a re-referral to urology.  He states he will call within few months.   I discussed the assessment and treatment plan with the patient. The patient was provided an opportunity to ask questions and all were answered. The patient agreed with the plan and demonstrated an understanding of the instructions.   The patient was advised to call back or seek an in-person evaluation if the symptoms worsen or if the condition fails to improve as anticipated.  I provided 18 minutes of non-face-to-face time during this encounter.  Willow OraJose Shakerria Parran, MD

## 2019-02-08 NOTE — Telephone Encounter (Signed)
Virtual Visit scheduled w/ PCP today.

## 2019-02-09 ENCOUNTER — Ambulatory Visit (INDEPENDENT_AMBULATORY_CARE_PROVIDER_SITE_OTHER): Payer: BLUE CROSS/BLUE SHIELD | Admitting: Internal Medicine

## 2019-02-09 ENCOUNTER — Other Ambulatory Visit: Payer: Self-pay

## 2019-02-09 DIAGNOSIS — R509 Fever, unspecified: Secondary | ICD-10-CM | POA: Diagnosis not present

## 2019-02-09 DIAGNOSIS — R972 Elevated prostate specific antigen [PSA]: Secondary | ICD-10-CM | POA: Insufficient documentation

## 2019-02-09 NOTE — Progress Notes (Signed)
Subjective:    Patient ID: Christopher Guerra, male    DOB: 12-02-1955, 63 y.o.   MRN: [id number]  DOS:  02/09/2019 Type of visit - description: Virtual Visit via Video Note  I connected with@ on 02/09/19 at  1:20 PM EDT by a video enabled telemedicine application and verified that I am speaking with the correct person using two identifiers.   THIS ENCOUNTER IS A VIRTUAL VISIT DUE TO COVID-19 - PATIENT WAS NOT SEEN IN THE OFFICE. PATIENT HAS CONSENTED TO VIRTUAL VISIT / TELEMEDICINE VISIT   Location of patient: home  Location of provider: office  I discussed the limitations of evaluation and management by telemedicine and the availability of in person appointments. The patient expressed understanding and agreed to proceed.  History of Present Illness: Acute visit, seen today again at patient request. The patient was evaluated yesterday for fever, he was doing okay until 9 PM last night when the fever came back, 100.1.  He took Tylenol and the fever decreased. At 4 AM in the morning the fever returned. He is concerned about it. Would like to be tested for COVID-19.   Review of Systems He again denies runny nose, sore throat, sinus pain or congestion No chest pain, difficulty breathing.  No cough No nausea, vomiting, diarrhea + Myalgias, generalized, mild to moderate.  Past Medical History:  Diagnosis Date  . Diabetes mellitus without complication (HCC)   . Elevated PSA   . Gout    ? of gout  . Tinnitus     Past Surgical History:  Procedure Laterality Date  . CATARACT EXTRACTION Bilateral 2015    Social History   Socioeconomic History  . Marital status: Married    Spouse name: Not on file  . Number of children: 3  . Years of education: Not on file  . Highest education level: Not on file  Occupational History  . Occupation: works Clinical cytogeneticistindepedently , part time  Social Needs  . Financial resource strain: Not on file  . Food insecurity:    Worry: Not on file   Inability: Not on file  . Transportation needs:    Medical: Not on file    Non-medical: Not on file  Tobacco Use  . Smoking status: Former Games developermoker  . Smokeless tobacco: Never Used  Substance and Sexual Activity  . Alcohol use: Yes    Comment: socially   . Drug use: No  . Sexual activity: Not on file  Lifestyle  . Physical activity:    Days per week: Not on file    Minutes per session: Not on file  . Stress: Not on file  Relationships  . Social connections:    Talks on phone: Not on file    Gets together: Not on file    Attends religious service: Not on file    Active member of club or organization: Not on file    Attends meetings of clubs or organizations: Not on file    Relationship status: Not on file  . Intimate partner violence:    Fear of current or ex partner: Not on file    Emotionally abused: Not on file    Physically abused: Not on file    Forced sexual activity: Not on file  Other Topics Concern  . Not on file  Social History Narrative   Original from [place], 3 daughters, wife has an additional child, 2 adopted children    Lives w/ wife      Allergies as of  02/09/2019   No Known Allergies     Medication List    as of February 09, 2019  1:14 PM   You have not been prescribed any medications.         Objective:   Physical Exam There were no vitals taken for this visit. This is video virtual visit, the video worked well for 2 minutes  then we had to transfer to phone visit.  He was alert oriented x3, no apparent distress    Assessment     Assessment DM: A1c 6.5   (2013) H/o Gout  H/o  Mild Tinnitus , reports he saw ENT before  Cataract surgery B  PLAN: Fever: Ongoing fever, no other symptoms except for myalgias.  He likes to be tested for covid -19. My advice to the patient is to be evaluated F2F: he is 28, possibly infected with coronavirus , thus rec to go to the ER or a urgent care, the patient is quite reluctant. I explained the patient he  needs to be seen for further evaluation such as chest x-ray, CBC, flu test etc.  Per current policies, he probably will not qualify for COVID testing. I also explained that that he is 63 and at risk of sudden, severe complications. He remains reluctant, advised that he decided not to go to the ER or urgent care today, then he needs to take Tylenol, aggressive hydration and  pay attention >>> if he has sustained/high fever, chest pain, difficulty breathing, severe cough then he needs to go to the ER.    I discussed the assessment and treatment plan with the patient. The patient was provided an opportunity to ask questions and all were answered. The patient agreed with the plan and demonstrated an understanding of the instructions.   The patient was advised to call back or seek an in-person evaluation if the symptoms worsen or if the condition fails to improve as anticipated.

## 2019-02-09 NOTE — Assessment & Plan Note (Signed)
Fever: As described above, in the midst of the COVID-19 bandemia, this could be COVID.  Recommend isolation for 1 week and  3 days without fever.  He lives with his wife grandchildren, recommend extreme precautions while at home including using a mask and monitor his family closely. If he gets worse >>> High fever, chest pain, cough, malaise then  needs to call or go to the ER. He verbalized understanding. Other issues: Increased PSA: Last visit with urology that I can tell was in 2018, was recommended tamsulosin and they were considering biopsy. Recommend to come back at his earliest convenience once  the situation with COVID-19 improves, he needs labs & likely a re-referral to urology.  He states he will call within few months.

## 2019-02-10 NOTE — Assessment & Plan Note (Signed)
Fever: Ongoing fever, no other symptoms except for myalgias.  He likes to be tested for covid -19. My advice to the patient is to be evaluated F2F: he is 62, possibly infected with coronavirus , thus rec to go to the ER or a urgent care, the patient is quite reluctant. I explained the patient he needs to be seen for further evaluation such as chest x-ray, CBC, flu test etc.  Per current policies, he probably will not qualify for COVID testing. I also explained that that he is 63 and at risk of sudden, severe complications. He remains reluctant, advised that he decided not to go to the ER or urgent care today, then he needs to take Tylenol, aggressive hydration and  pay attention >>> if he has sustained/high fever, chest pain, difficulty breathing, severe cough then he needs to go to the ER.

## 2019-02-14 ENCOUNTER — Other Ambulatory Visit: Payer: Self-pay

## 2019-02-14 ENCOUNTER — Encounter (HOSPITAL_BASED_OUTPATIENT_CLINIC_OR_DEPARTMENT_OTHER): Payer: Self-pay | Admitting: Emergency Medicine

## 2019-02-14 ENCOUNTER — Telehealth: Payer: Self-pay

## 2019-02-14 ENCOUNTER — Emergency Department (HOSPITAL_BASED_OUTPATIENT_CLINIC_OR_DEPARTMENT_OTHER)
Admission: EM | Admit: 2019-02-14 | Discharge: 2019-02-14 | Disposition: A | Discharge status: Home / Self Care | Payer: BLUE CROSS/BLUE SHIELD | Attending: Emergency Medicine | Admitting: Emergency Medicine

## 2019-02-14 ENCOUNTER — Emergency Department (HOSPITAL_BASED_OUTPATIENT_CLINIC_OR_DEPARTMENT_OTHER): Payer: BLUE CROSS/BLUE SHIELD

## 2019-02-14 DIAGNOSIS — R509 Fever, unspecified: Secondary | ICD-10-CM | POA: Diagnosis not present

## 2019-02-14 DIAGNOSIS — Z20822 Contact with and (suspected) exposure to covid-19: Secondary | ICD-10-CM

## 2019-02-14 DIAGNOSIS — Z87891 Personal history of nicotine dependence: Secondary | ICD-10-CM | POA: Insufficient documentation

## 2019-02-14 DIAGNOSIS — E119 Type 2 diabetes mellitus without complications: Secondary | ICD-10-CM | POA: Diagnosis not present

## 2019-02-14 DIAGNOSIS — J189 Pneumonia, unspecified organism: Secondary | ICD-10-CM | POA: Diagnosis not present

## 2019-02-14 DIAGNOSIS — Z03818 Encounter for observation for suspected exposure to other biological agents ruled out: Secondary | ICD-10-CM | POA: Diagnosis not present

## 2019-02-14 DIAGNOSIS — R918 Other nonspecific abnormal finding of lung field: Secondary | ICD-10-CM | POA: Diagnosis not present

## 2019-02-14 DIAGNOSIS — R6889 Other general symptoms and signs: Secondary | ICD-10-CM

## 2019-02-14 MED ORDER — AEROCHAMBER PLUS FLO-VU MISC
1.0000 | Freq: Once | Status: AC
  Administered 2019-02-14: 1
  Filled 2019-02-14: qty 1

## 2019-02-14 MED ORDER — ALBUTEROL SULFATE HFA 108 (90 BASE) MCG/ACT IN AERS
2.0000 | INHALATION_SPRAY | Freq: Once | RESPIRATORY_TRACT | Status: AC
  Administered 2019-02-14: 2 via RESPIRATORY_TRACT
  Filled 2019-02-14: qty 6.7

## 2019-02-14 MED ORDER — AZITHROMYCIN 250 MG PO TABS: 250.0000 mg | ORAL_TABLET | Freq: Every day | ORAL | 0 refills | Status: DC

## 2019-02-14 MED FILL — AZITHROMYCIN 250 MG TABLET: 250 | 5 days supply | Qty: 6 | Fill #0

## 2019-02-14 NOTE — ED Provider Notes (Signed)
[facility] EMERGENCY DEPARTMENT Provider Note   CSN: [id number] Arrival date & time: 02/14/19  1149    History   Chief Complaint Chief Complaint  Patient presents with   fever and body aches    HPI Christopher Guerra is a 63 y.o. male.     Christopher Guerra is a 63 y.o. male with a history of diabetes, gout, elevated PSA, who presents to the emergency department for evaluation of 1 week of fevers and body aches.  He reports occasional associated cough with some intermittent chest tightness and shortness of breath.  No persistent chest pain or shortness of breath.  He has had persistent fevers throughout the week for which she has been taking Tylenol, last took Tylenol at 10 AM, reports taking it regularly every 8 hours.  He denies associated congestion or sore throat.  No abdominal pain, nausea vomiting or diarrhea.  Reports diffuse body aches but no focal pain in one area.  No numbness weakness or tingling, no headaches.  His wife is here with similar symptoms as well.  He initially had a tele-visit with his primary care doctor when symptoms began a week ago, and patient reports he was told to quarantine at home which she has been doing but was concerned that his symptoms were not improving after week so presents to the emergency department today.  He has not been taking anything other than Tylenol for his symptoms.  No other aggravating or alleviating factors.     Past Medical History:  Diagnosis Date   Diabetes mellitus without complication (HCC)    Elevated PSA    Gout    ? of gout   Tinnitus     Patient Active Problem List   Diagnosis Date Noted   PSA elevation 02/09/2019   Diabetes (HCC) 09/11/2016   PCP NOTES >>>>>>>>>>> 09/11/2016   Gout ? 10/04/2012   Annual physical exam 07/17/2011    Past Surgical History:  Procedure Laterality Date   CATARACT EXTRACTION Bilateral 2015        Home Medications    Prior to Admission medications    Medication Sig Start Date End Date Taking? Authorizing Provider  azithromycin (ZITHROMAX) 250 MG tablet Take 1 tablet (250 mg total) by mouth daily. Take first 2 tablets together, then 1 every day until finished. 02/14/19   Dartha Lodge, PA-C    Family History Family History  Problem Relation Age of Onset   Diabetes Other        M late onset   Coronary artery disease Neg Hx    Stroke Neg Hx    Prostate cancer Neg Hx    Colon cancer Neg Hx    Rectal cancer Neg Hx    Stomach cancer Neg Hx    Esophageal cancer Neg Hx     Social History Social History   Tobacco Use   Smoking status: Former Smoker   Smokeless tobacco: Never Used  Substance Use Topics   Alcohol use: Yes    Comment: socially    Drug use: No     Allergies   Patient has no known allergies.   Review of Systems Review of Systems  Constitutional: Positive for chills, fatigue and fever.  HENT: Negative for congestion, rhinorrhea and sore throat.   Respiratory: Positive for cough, chest tightness and shortness of breath.   Cardiovascular: Negative for chest pain and leg swelling.  Gastrointestinal: Negative for abdominal pain, diarrhea, nausea and vomiting.  Genitourinary: Negative for dysuria  and frequency.  Musculoskeletal: Positive for myalgias.  Skin: Negative for color change and rash.  Neurological: Negative for dizziness, syncope and light-headedness.     Physical Exam Updated Vital Signs BP (!) 158/88 (BP Location: Right Arm)    Pulse 67    Temp 98.3 F (36.8 C) (Oral)    Resp 18    Ht 5\' 1"  (1.549 m)    Wt 72.6 kg    SpO2 97%    BMI 30.23 kg/m   Physical Exam Vitals signs and nursing note reviewed.  Constitutional:      General: He is not in acute distress.    Appearance: Normal appearance. He is well-developed and normal weight. He is not ill-appearing or diaphoretic.  HENT:     Head: Normocephalic and atraumatic.     Nose: Nose normal.     Mouth/Throat:     Mouth: Mucous  membranes are moist.     Pharynx: Oropharynx is clear.  Eyes:     General:        Right eye: No discharge.        Left eye: No discharge.  Neck:     Musculoskeletal: Neck supple.  Cardiovascular:     Rate and Rhythm: Normal rate and regular rhythm.     Pulses: Normal pulses.     Heart sounds: Normal heart sounds. No murmur. No friction rub.  Pulmonary:     Effort: Pulmonary effort is normal. No respiratory distress.     Breath sounds: Normal breath sounds.     Comments: Respirations equal and unlabored, patient able to speak in full sentences with normal respiratory effort, no tachypnea, satting well on room air, lungs clear to auscultation bilaterally Abdominal:     General: Abdomen is flat. Bowel sounds are normal. There is no distension.     Palpations: Abdomen is soft. There is no mass.     Tenderness: There is no abdominal tenderness. There is no guarding.     Comments: Abdomen soft, nondistended, nontender to palpation in all quadrants without guarding or peritoneal signs  Musculoskeletal:        General: No deformity.     Right lower leg: No edema.     Left lower leg: No edema.     Comments: No lower extremity edema bilaterally.  Skin:    General: Skin is warm and dry.     Capillary Refill: Capillary refill takes less than 2 seconds.  Neurological:     Mental Status: He is alert and oriented to person, place, and time.     Coordination: Coordination normal.  Psychiatric:        Mood and Affect: Mood normal.        Behavior: Behavior normal.      ED Treatments / Results  Labs (all labs ordered are listed, but only abnormal results are displayed) Labs Reviewed - No data to display  EKG None  [facility]  Result Date: 02/14/2019 CLINICAL DATA:  63 year old male with fever 1 week ago EXAM: PORTABLE CHEST 1 VIEW COMPARISON:  None. FINDINGS: Cardiomediastinal silhouette within normal limits. Patchy airspace opacity bilaterally. No pneumothorax. No  large pleural effusion. No interlobular septal thickening. IMPRESSION: Bilateral airspace opacity, compatible with multifocal pneumonia given the history. Electronically Signed   By: Gilmer MorJaime  Wagner D.O.   On: 02/14/2019 12:58    Procedures Procedures (including critical care time)  Medications Ordered in ED Medications  albuterol (VENTOLIN HFA) 108 (90 Base) MCG/ACT inhaler 2  puff (2 puffs Inhalation Given 02/14/19 1312)  aerochamber plus with mask device 1 each (1 each Other Given 02/14/19 1315)     Initial Impression / Assessment and Plan / ED Course  I have reviewed the triage vital signs and the nursing notes.  Pertinent labs & imaging results that were available during my care of the patient were reviewed by me and considered in my medical decision making (see chart for details).  63 year old male with 1 week of fever, body aches, occasional cough with some intermittent shortness of breath and chest tightness.  On arrival patient is afebrile with normal vitals, he shows no evidence of respiratory distress, satting at 97% on room air with no tachypnea no increased work of breathing he is able to speak in complete sentences without any difficulty.  I suspect that his symptoms are related to COVID-19 infection, his wife is here with similar symptoms as well, they have been quarantining at home since symptoms started 1 week ago.  He had Tylenol 2 hours prior to arrival.  Lungs are clear on auscultation and he is otherwise well-appearing with no focal findings on exam.  Will check chest x-ray and have patient ambulate in the room on pulse ox.  We will also give albuterol treatment.  Assuming that patient maintains normal O2 saturations feel that he would likely be a candidate for discharge.  Patient's chest x-ray does show bilateral airspace opacities suggestive of multifocal pneumonia, and I feel this is likely due to COVID-19.  Despite chest x-ray findings patient was ambulated in his room at a  brisk pace and maintained a heart rate between 73 and 82 and SPO2 96-98%.  This is very reassuring.  I feel that patient can be discharged to continue to treat supportively at home, will treat with azithromycin to cover for any potential atypical pneumonia.  We will also send home patient with albuterol inhaler and have him continue with Tylenol.  I have stressed the importance of continuing to quarantine at home.  I have encouraged the patient to purchase a home pulse oximeter so that he can check his oxygen and I have provided strict return precautions for any increase shortness of breath or difficulty breathing.  Patient expresses understanding and agreement with this plan.  He is discharged home in good condition.  Christopher Guerra was evaluated in Emergency Department on 02/14/2019 for the symptoms described in the history of present illness. He was evaluated in the context of the global COVID-19 pandemic, which necessitated consideration that the patient might be at risk for infection with the SARS-CoV-2 virus that causes COVID-19. Institutional protocols and algorithms that pertain to the evaluation of patients at risk for COVID-19 are in a state of rapid change based on information released by regulatory bodies including the CDC and federal and state organizations. These policies and algorithms were followed during the patient's care in the ED.   Final Clinical Impressions(s) / ED Diagnoses   Final diagnoses:  Multifocal pneumonia  Suspected Covid-19 Virus Infection    ED Discharge Orders         Ordered    azithromycin (ZITHROMAX) 250 MG tablet  Daily     02/14/19 1356           Dartha Lodge, [place] 02/14/19 1414    Little, Ambrose [place], MD 02/15/19 [id number]

## 2019-02-14 NOTE — Telephone Encounter (Signed)
Pt at ED earlier today, please schedule viral visit this week for ED f/u.

## 2019-02-14 NOTE — ED Triage Notes (Signed)
Pt developed fever 1 week ago today.  PMD told pt to stay home and quarantine, which he did. Sts he is still having fevers and body aches.  Denies cough. Last took Tylenol at 10am. Wife also has same sx that started at the same time.

## 2019-02-14 NOTE — Discharge Instructions (Addendum)
Your symptoms are likely caused by the coronavirus.  Please continue quarantining at home.    You can continue to take Tylenol every 8 hours for fever and body aches.  You may use your albuterol inhaler that you were given today every 4 hours as needed for chest tightness or shortness of breath.  Please take antibiotics as prescribed to you over the next 5 days.  I would like for you to purchase a pulse ox from the local drugstore or [employer] to help measure your oxygen at home.  If you start having worsening shortness of breath please check your oxygen and if it is less than 90% you should immediately come to the hospital.  Please return to the hospital if you are also having increasing trouble breathing you feel that your breathing fast or any other new or concerning symptoms occur.

## 2019-02-14 NOTE — ED Notes (Signed)
Ambulated in room r/a HR 73-82, SpO2 96-98%, quick pace.

## 2019-02-15 ENCOUNTER — Telehealth: Payer: Self-pay | Admitting: Internal Medicine

## 2019-02-15 NOTE — Telephone Encounter (Signed)
Please arrange virtual follow-up this week

## 2019-02-15 NOTE — Telephone Encounter (Signed)
A note has already been sent to [facility] to call and schedule.

## 2019-02-16 NOTE — Telephone Encounter (Signed)
Schedule pt VOV ED appt with provider for 02-17-2019. Done

## 2019-02-17 ENCOUNTER — Other Ambulatory Visit: Payer: Self-pay

## 2019-02-17 ENCOUNTER — Ambulatory Visit (INDEPENDENT_AMBULATORY_CARE_PROVIDER_SITE_OTHER): Payer: BLUE CROSS/BLUE SHIELD | Admitting: Internal Medicine

## 2019-02-17 DIAGNOSIS — J189 Pneumonia, unspecified organism: Secondary | ICD-10-CM | POA: Diagnosis not present

## 2019-02-17 NOTE — Progress Notes (Signed)
Subjective:    Patient ID: Christopher Guerra, male    DOB: 13-Dec-1955, 63 y.o.   MRN: [id number]  DOS:  02/17/2019 Type of visit - description: Attempted  to make this a video visit, due to technical difficulties from the patient side it was not possible  thus we proceeded with a Virtual Visit via Telephone    I connected with@ on 02/17/19 at 10:20 AM EDT by telephone and verified that I am speaking with the correct person using two identifiers.  THIS ENCOUNTER IS A VIRTUAL VISIT DUE TO COVID-19 - PATIENT WAS NOT SEEN IN THE OFFICE. PATIENT HAS CONSENTED TO VIRTUAL VISIT / TELEMEDICINE VISIT   Location of patient: home  Location of provider: office  I discussed the limitations, risks, security and privacy concerns of performing an evaluation and management service by telephone and the availability of in person appointments. I also discussed with the patient that there may be a patient responsible charge related to this service. The patient expressed understanding and agreed to proceed.   History of Present Illness: ER follow-up  Patient was seen in the ER 02/14/2019, at the time he presented with, intermittent chest pain and shortness of breath. Also fever. He did not look toxic, chest x-ray showed multifocal pneumonia.  He was felt to have COVID-19 but look well enough to be treated as an outpatient. Was prescribed a Z-Pak and albuterol.  Today, he reports that he is taking the medication as recommended and he feels better.  Cough essentially resolved, no fevers, still have some body aches and fatigue.  Review of Systems Denies chest pain at this point. No nausea, vomiting, diarrhea. No rash Currently not working and is sheltering at home.  Past Medical History:  Diagnosis Date  . Diabetes mellitus without complication (HCC)   . Elevated PSA   . Gout    ? of gout  . Tinnitus     Past Surgical History:  Procedure Laterality Date  . CATARACT EXTRACTION Bilateral 2015     Social History   Socioeconomic History  . Marital status: Married    Spouse name: Not on file  . Number of children: 3  . Years of education: Not on file  . Highest education level: Not on file  Occupational History  . Occupation: works Clinical cytogeneticistindepedently , part time  Social Needs  . Financial resource strain: Not on file  . Food insecurity:    Worry: Not on file    Inability: Not on file  . Transportation needs:    Medical: Not on file    Non-medical: Not on file  Tobacco Use  . Smoking status: Former Games developermoker  . Smokeless tobacco: Never Used  Substance and Sexual Activity  . Alcohol use: Yes    Comment: socially   . Drug use: No  . Sexual activity: Not on file  Lifestyle  . Physical activity:    Days per week: Not on file    Minutes per session: Not on file  . Stress: Not on file  Relationships  . Social connections:    Talks on phone: Not on file    Gets together: Not on file    Attends religious service: Not on file    Active member of club or organization: Not on file    Attends meetings of clubs or organizations: Not on file    Relationship status: Not on file  . Intimate partner violence:    Fear of current or ex partner: Not on  file    Emotionally abused: Not on file    Physically abused: Not on file    Forced sexual activity: Not on file  Other Topics Concern  . Not on file  Social History Narrative   Original from [place], 3 daughters, wife has an additional child, 2 adopted children    Lives w/ wife      Allergies as of 02/17/2019   No Known Allergies     Medication List       Accurate as of Feb 17, 2019 10:23 AM. If you have any questions, ask your nurse or doctor.        azithromycin 250 MG tablet Commonly known as:  ZITHROMAX Take 1 tablet (250 mg total) by mouth daily. Take first 2 tablets together, then 1 every day until finished.           Objective:   Physical Exam There were no vitals taken for this visit. This is virtual phone  appointment, alert oriented x3, no distress noted, speaking in complete sentences, no cough.    Assessment      Assessment DM: A1c 6.5   (2013) H/o Gout  H/o  Mild Tinnitus , reports he saw ENT before  Cataract surgery B  PLAN: Pneumonia: Likely due to COVID-19.  Went to the ER, diagnosed with pneumonia by x-ray, did not look toxic, O2 sats were satisfactory, was released home on Z-Pak and albuterol. The patient reports improvement. Plan: Continue present care, good hydration, he needs to consider himself contagious until he is 3 days without fever and without taking Tylenol.  Patient verbalized understanding, will call if the improvement does not continue.  Also request a CPX, will schedule a physical face-to-face in 6 to 8 weeks.  Will need a follow-up chest x-ray then.     I discussed the assessment and treatment plan with the patient. The patient was provided an opportunity to ask questions and all were answered. The patient agreed with the plan and demonstrated an understanding of the instructions.   The patient was advised to call back or seek an in-person evaluation if the symptoms worsen or if the condition fails to improve as anticipated.  I provided 20 minutes of non-face-to-face time during this encounter.  Willow Ora, MD

## 2019-02-18 NOTE — Assessment & Plan Note (Signed)
Pneumonia: Likely due to COVID-19.  Went to the ER, diagnosed with pneumonia by x-ray, did not look toxic, O2 sats were satisfactory, was released home on Z-Pak and albuterol. The patient reports improvement. Plan: Continue present care, good hydration, he needs to consider himself contagious until he is 3 days without fever and without taking Tylenol.  Patient verbalized understanding, will call if the improvement does not continue.  Also request a CPX, will schedule a physical face-to-face in 6 to 8 weeks.  Will need a follow-up chest x-ray then.

## 2019-03-30 ENCOUNTER — Ambulatory Visit: Payer: Self-pay | Admitting: *Deleted

## 2019-03-30 NOTE — Telephone Encounter (Signed)
Dizzy spells since Monday-Patient states he has had spinning sensation at times.  BP is elevated today- but reports no heart rate changes. Daughter is translating for patient.  Reason for Disposition . [1] MODERATE dizziness (e.g., vertigo; feels very unsteady, interferes with normal activities) AND [2] has NOT been evaluated by physician for this  Answer Assessment - Initial Assessment Questions 1. DESCRIPTION: "Describe your dizziness."     Room is spinning 2. LIGHTHEADED: "Do you feel lightheaded?" (e.g., somewhat faint, woozy, weak upon standing)     Weak but not faint 3. VERTIGO: "Do you feel like either you or the room is spinning or tilting?" (i.e. vertigo)     Not all the time- almost like a drunk feeling- pressure on eyes 4. SEVERITY: "How bad is it?"  "Do you feel like you are going to faint?" "Can you stand and walk?"   - MILD - walking normally   - MODERATE - interferes with normal activities (e.g., work, school)    - SEVERE - unable to stand, requires support to walk, feels like passing out now.      Mild/moderate 5. ONSET:  "When did the dizziness begin?"     Monday- sleeping on side and turned over and felt it 6. AGGRAVATING FACTORS: "Does anything make it worse?" (e.g., standing, change in head position)     Standing,laying down 7. HEART RATE: "Can you tell me your heart rate?" "How many beats in 15 seconds?"  (Note: not all patients can do this)       BP- 158/92 does not feel heart is irregular 8. CAUSE: "What do you think is causing the dizziness?"     unknown 9. RECURRENT SYMPTOM: "Have you had dizziness before?" If so, ask: "When was the last time?" "What happened that time?"     Yes- 1 day incident 10. OTHER SYMPTOMS: "Do you have any other symptoms?" (e.g., fever, chest pain, vomiting, diarrhea, bleeding)       no 11. PREGNANCY: "Is there any chance you are pregnant?" "When was your last menstrual period?"       n/a  Protocols used: DIZZINESS - VERTIGO-A-AH,  Kennedy

## 2019-03-30 NOTE — Telephone Encounter (Signed)
In person visit scheduled for tomorrow w/ PCP.

## 2019-03-31 ENCOUNTER — Other Ambulatory Visit: Payer: Self-pay

## 2019-03-31 ENCOUNTER — Ambulatory Visit (HOSPITAL_BASED_OUTPATIENT_CLINIC_OR_DEPARTMENT_OTHER)
Admission: RE | Admit: 2019-03-31 | Discharge: 2019-03-31 | Disposition: A | Discharge status: Home / Self Care | Payer: BC Managed Care – PPO | Source: Ambulatory Visit | Attending: Internal Medicine | Admitting: Internal Medicine

## 2019-03-31 ENCOUNTER — Ambulatory Visit (INDEPENDENT_AMBULATORY_CARE_PROVIDER_SITE_OTHER): Payer: BC Managed Care – PPO | Admitting: Internal Medicine

## 2019-03-31 ENCOUNTER — Encounter: Payer: Self-pay | Admitting: Internal Medicine

## 2019-03-31 VITALS — BP 166/92 | HR 59 | Temp 98.1°F | Resp 16 | Ht 62.0 in | Wt 172.2 lb

## 2019-03-31 DIAGNOSIS — M47814 Spondylosis without myelopathy or radiculopathy, thoracic region: Secondary | ICD-10-CM | POA: Diagnosis not present

## 2019-03-31 DIAGNOSIS — R918 Other nonspecific abnormal finding of lung field: Secondary | ICD-10-CM | POA: Diagnosis not present

## 2019-03-31 DIAGNOSIS — Z8701 Personal history of pneumonia (recurrent): Secondary | ICD-10-CM | POA: Diagnosis not present

## 2019-03-31 DIAGNOSIS — I10 Essential (primary) hypertension: Secondary | ICD-10-CM | POA: Diagnosis not present

## 2019-03-31 DIAGNOSIS — J189 Pneumonia, unspecified organism: Secondary | ICD-10-CM | POA: Insufficient documentation

## 2019-03-31 DIAGNOSIS — J986 Disorders of diaphragm: Secondary | ICD-10-CM | POA: Diagnosis not present

## 2019-03-31 DIAGNOSIS — R42 Dizziness and giddiness: Secondary | ICD-10-CM | POA: Diagnosis not present

## 2019-03-31 LAB — CBC WITH DIFFERENTIAL/PLATELET
Basophils Absolute: 0 10*3/uL (ref 0.0–0.1)
Basophils Relative: 0.4 % (ref 0.0–3.0)
Eosinophils Absolute: 0 10*3/uL (ref 0.0–0.7)
Eosinophils Relative: 0.7 % (ref 0.0–5.0)
HCT: 40 % (ref 39.0–52.0)
Hemoglobin: 13.1 g/dL (ref 13.0–17.0)
Lymphocytes Relative: 40.5 % (ref 12.0–46.0)
Lymphs Abs: 2.8 10*3/uL (ref 0.7–4.0)
MCHC: 32.9 g/dL (ref 30.0–36.0)
MCV: 88.1 fl (ref 78.0–100.0)
Monocytes Absolute: 0.5 10*3/uL (ref 0.1–1.0)
Monocytes Relative: 7.5 % (ref 3.0–12.0)
Neutro Abs: 3.5 10*3/uL (ref 1.4–7.7)
Neutrophils Relative %: 50.9 % (ref 43.0–77.0)
Platelets: 242 10*3/uL (ref 150.0–400.0)
RBC: 4.54 Mil/uL (ref 4.22–5.81)
RDW: 15.8 % — ABNORMAL HIGH (ref 11.5–15.5)
WBC: 6.8 10*3/uL (ref 4.0–10.5)

## 2019-03-31 LAB — COMPREHENSIVE METABOLIC PANEL
ALT: 22 U/L (ref 0–53)
AST: 25 U/L (ref 0–37)
Albumin: 4 g/dL (ref 3.5–5.2)
Alkaline Phosphatase: 68 U/L (ref 39–117)
BUN: 19 mg/dL (ref 6–23)
CO2: 26 mEq/L (ref 19–32)
Calcium: 9 mg/dL (ref 8.4–10.5)
Chloride: 104 mEq/L (ref 96–112)
Creatinine, Ser: 0.89 mg/dL (ref 0.40–1.50)
GFR: 86.22 mL/min (ref >60.00)
Glucose, Bld: 93 mg/dL (ref 70–99)
Potassium: 4.2 mEq/L (ref 3.5–5.1)
Sodium: 138 mEq/L (ref 135–145)
Total Bilirubin: 0.4 mg/dL (ref 0.2–1.2)
Total Protein: 7 g/dL (ref 6.0–8.3)

## 2019-03-31 MED ORDER — MECLIZINE HCL 12.5 MG PO TABS: 12.5000 mg | ORAL_TABLET | Freq: Four times a day (QID) | ORAL | 0 refills | Status: DC | PRN

## 2019-03-31 MED ORDER — AMLODIPINE BESYLATE 5 MG PO TABS: 5.0000 mg | ORAL_TABLET | Freq: Every day | ORAL | 3 refills | Status: DC

## 2019-03-31 NOTE — Progress Notes (Signed)
Pre visit review using our clinic review tool, if applicable. No additional management support is needed unless otherwise documented below in the visit note. 

## 2019-03-31 NOTE — Progress Notes (Signed)
Subjective:    Patient ID: Christopher Guerra, male    DOB: 1956/09/03, 63 y.o.   MRN: [id number]  DOS:  03/31/2019 Type of visit - description: acute Symptoms are started acutely 03/18/2019.  He was in bed, turn around and immediately got dizzy described as a spinning. He is feeling that way since then, symptoms increase with certain movements and decrease when he is quiet. Today, symptoms have decreased he is feeling simply off balance.  I noted his blood pressure to be elevated, he is not orthostatic, on chart review, last  BP was also elevated.  Has a history of pneumonia, now asymptomatic, due for a chest x-ray.   Review of Systems  Denies chest pain, difficulty breathing. No fever or chills No nausea, vomiting, diarrhea No cough No headache, diplopia, slurred speech or motor deficits  Past Medical History:  Diagnosis Date  . Diabetes mellitus without complication (HCC)   . Elevated PSA   . Gout    ? of gout  . Tinnitus     Past Surgical History:  Procedure Laterality Date  . CATARACT EXTRACTION Bilateral 2015    Social History   Socioeconomic History  . Marital status: Married    Spouse name: Not on file  . Number of children: 3  . Years of education: Not on file  . Highest education level: Not on file  Occupational History  . Occupation: works Clinical cytogeneticistindepedently , part time  Social Needs  . Financial resource strain: Not on file  . Food insecurity    Worry: Not on file    Inability: Not on file  . Transportation needs    Medical: Not on file    Non-medical: Not on file  Tobacco Use  . Smoking status: Former Games developermoker  . Smokeless tobacco: Never Used  Substance and Sexual Activity  . Alcohol use: Yes    Comment: socially   . Drug use: No  . Sexual activity: Not on file  Lifestyle  . Physical activity    Days per week: Not on file    Minutes per session: Not on file  . Stress: Not on file  Relationships  . Social Musicianconnections    Talks on phone: Not on  file    Gets together: Not on file    Attends religious service: Not on file    Active member of club or organization: Not on file    Attends meetings of clubs or organizations: Not on file    Relationship status: Not on file  . Intimate partner violence    Fear of current or ex partner: Not on file    Emotionally abused: Not on file    Physically abused: Not on file    Forced sexual activity: Not on file  Other Topics Concern  . Not on file  Social History Narrative   [place] from [place], 3 daughters, wife has an additional child, 2 adopted children    Lives w/ wife      Allergies as of 03/31/2019   No Known Allergies     Medication List       Accurate as of March 31, 2019  8:46 AM. If you have any questions, ask your nurse or doctor.        azithromycin 250 MG tablet Commonly known as: ZITHROMAX Take 1 tablet (250 mg total) by mouth daily. Take first 2 tablets together, then 1 every day until finished.           Objective:  Physical Exam BP (!) 166/92 (BP Location: Left Arm, Patient Position: Sitting, Cuff Size: Small)   Pulse (!) 59   Temp 98.1 F (36.7 C) (Oral)   Resp 16   Ht 5\' 2"  (1.575 m)   Wt 172 lb 4 oz (78.1 kg)   SpO2 99%   BMI 31.50 kg/m      General:   Well developed, NAD, BMI noted. HEENT:  Normocephalic . Face symmetric, atraumatic Lungs:  CTA B Normal respiratory effort, no intercostal retractions, no accessory muscle use. Heart: RRR,  no murmur.  No pretibial edema bilaterally  Skin: Not pale. Not jaundice Neurologic:  alert & oriented X3.  Speech normal, gait appropriate for age and unassisted. EOMI, face symmetric, tongue midline, motor symmetric, DTRs normal. Psych--  Cognition and judgment appear intact.  Cooperative with normal attention span and concentration.  Behavior appropriate. No anxious or depressed appearing.   Assessment        Assessment DM: A1c 6.5   (2013) H/o Gout  H/o  Mild Tinnitus , reports he saw  ENT before  Cataract surgery B PNM 02/2019, suspected Covid-19  PLAN: Dizziness: No red flag symptoms, neuro exam benign, suspect peripheral etiology.  Recommend rest, fluids, Antivert (watch for drowsiness) and observation.  If symptoms severe or if he has any strokelike symptoms knows to go to the emergency room. Pneumonia: Now asymptomatic, ordering follow-up chest x-ray HTN: BP today is elevated, it was elevated also few weeks ago.  We will get a CMP, CBC, start amlodipine.  Recommend to monitor BPs 3 times a week.  Goals discussed.  The patient has a daughter who works in the hospital is able to check his blood pressure. All of the above discussed carefully in Spanish. RTC 4 weeks CPX

## 2019-03-31 NOTE — Patient Instructions (Addendum)
Get the blood work     [facility] Schedule your next appointment for a physical in 1 months      STOP BY THE FIRST FLOOR:  get the XR     Check the  blood pressure 2 or 3 times a   week   Be sure your blood pressure is between 110/65 and  135/85. If it is consistently higher or lower, let me know  =====  TOMESE LA MUESTRA DE SANGRE  SAQUE UNA CITA PARA SU EXAMEN GENERAL EN 4 SEMANAS  TOMESE LA RADIOGRAFIA EN EL PRIMER PISO  DESCANSE, MUCHOS LIQUIDOS, LLAME SI SE PONE PEOR  TOME "MECLIZINE" SI TIENE MAREOS  EMPIEZE AMLODIPIN 5 MG 1 TABLETA AL DIA , TODOS LOS DIAS PARA LA HYPERTENSION  QUE SU HIJA LE TOME LA PRESION 3 VECES POR SEMANA, MANTENGA UNA LISTA CON LOS RESULTADOS   SU PRESIUON DEBE SER ~ 120/80

## 2019-04-01 DIAGNOSIS — I10 Essential (primary) hypertension: Secondary | ICD-10-CM | POA: Insufficient documentation

## 2019-04-01 NOTE — Assessment & Plan Note (Signed)
Dizziness: No red flag symptoms, neuro exam benign, suspect peripheral etiology.  Recommend rest, fluids, Antivert (watch for drowsiness) and observation.  If symptoms severe or if he has any strokelike symptoms knows to go to the emergency room. Pneumonia: Now asymptomatic, ordering follow-up chest x-ray HTN: BP today is elevated, it was elevated also few weeks ago.  We will get a CMP, CBC, start amlodipine.  Recommend to monitor BPs 3 times a week.  Goals discussed.  The patient has a daughter who works in the hospital is able to check his blood pressure. All of the above discussed carefully in Spanish. RTC 4 weeks CPX

## 2019-04-20 ENCOUNTER — Encounter: Payer: BLUE CROSS/BLUE SHIELD | Admitting: Internal Medicine

## 2019-04-28 ENCOUNTER — Encounter: Payer: Self-pay | Admitting: Internal Medicine

## 2019-04-28 ENCOUNTER — Other Ambulatory Visit: Payer: Self-pay

## 2019-04-28 ENCOUNTER — Ambulatory Visit (INDEPENDENT_AMBULATORY_CARE_PROVIDER_SITE_OTHER): Payer: BC Managed Care – PPO | Admitting: Internal Medicine

## 2019-04-28 VITALS — BP 139/86 | HR 66 | Temp 98.0°F | Resp 16 | Ht 62.0 in | Wt 173.0 lb

## 2019-04-28 DIAGNOSIS — I10 Essential (primary) hypertension: Secondary | ICD-10-CM

## 2019-04-28 DIAGNOSIS — E119 Type 2 diabetes mellitus without complications: Secondary | ICD-10-CM

## 2019-04-28 DIAGNOSIS — R972 Elevated prostate specific antigen [PSA]: Secondary | ICD-10-CM

## 2019-04-28 LAB — LIPID PANEL
Cholesterol: 236 mg/dL — ABNORMAL HIGH (ref 0–200)
HDL: 52.1 mg/dL (ref >39.00)
LDL Cholesterol: 161 mg/dL — ABNORMAL HIGH (ref 0–99)
NonHDL: 183.83
Total CHOL/HDL Ratio: 5
Triglycerides: 115 mg/dL (ref 0.0–149.0)
VLDL: 23 mg/dL (ref 0.0–40.0)

## 2019-04-28 LAB — HEMOGLOBIN A1C: Hgb A1c MFr Bld: 6.2 % (ref 4.6–6.5)

## 2019-04-28 MED ORDER — AMLODIPINE BESYLATE 5 MG PO TABS: 5.0000 mg | ORAL_TABLET | Freq: Every day | ORAL | 1 refills | Status: DC

## 2019-04-28 NOTE — Progress Notes (Signed)
Subjective:    Patient ID: Christopher Guerra, male    DOB: 11-04-1955, 63 y.o.   MRN: [id number]  DOS:  04/28/2019 Type of visit - description: F/U Dizziness: Resolved HTN: Started amlodipine, good compliance, ambulatory BPs improving Pneumonia: Follow-up chest x-ray showed significant improvement.    Review of Systems  Denies fever chills No chest pain no difficulty breathing. No lower extremity edema No cough No dysuria, gross hematuria or difficulty urinating  Past Medical History:  Diagnosis Date  . Diabetes mellitus without complication (HCC)   . Elevated PSA   . Gout    ? of gout  . Tinnitus     Past Surgical History:  Procedure Laterality Date  . CATARACT EXTRACTION Bilateral 2015    Social History   Socioeconomic History  . Marital status: Married    Spouse name: Not on file  . Number of children: 3  . Years of education: Not on file  . Highest education level: Not on file  Occupational History  . Occupation: works Clinical cytogeneticistindepedently , part time  Social Needs  . Financial resource strain: Not on file  . Food insecurity    Worry: Not on file    Inability: Not on file  . Transportation needs    Medical: Not on file    Non-medical: Not on file  Tobacco Use  . Smoking status: Former Games developermoker  . Smokeless tobacco: Never Used  Substance and Sexual Activity  . Alcohol use: Yes    Comment: socially   . Drug use: No  . Sexual activity: Not on file  Lifestyle  . Physical activity    Days per week: Not on file    Minutes per session: Not on file  . Stress: Not on file  Relationships  . Social Musicianconnections    Talks on phone: Not on file    Gets together: Not on file    Attends religious service: Not on file    Active member of club or organization: Not on file    Attends meetings of clubs or organizations: Not on file    Relationship status: Not on file  . Intimate partner violence    Fear of current or ex partner: Not on file    Emotionally abused:  Not on file    Physically abused: Not on file    Forced sexual activity: Not on file  Other Topics Concern  . Not on file  Social History Narrative   Original from [place], 3 daughters, wife has an additional child, 2 adopted children    Lives w/ wife      Allergies as of 04/28/2019   No Known Allergies     Medication List       Accurate as of April 28, 2019 11:59 PM. If you have any questions, ask your nurse or doctor.        STOP taking these medications   meclizine 12.5 MG tablet Commonly known as: ANTIVERT Stopped by: Willow OraJose Mariely Mahr, MD     TAKE these medications   amLODipine 5 MG tablet Commonly known as: NORVASC Take 1 tablet (5 mg total) by mouth daily.   tamsulosin 0.4 MG Caps capsule Commonly known as: FLOMAX Take 0.4 mg by mouth.           Objective:   Physical Exam BP 139/86 (BP Location: Left Arm, Patient Position: Sitting, Cuff Size: Small)   Pulse 66   Temp 98 F (36.7 C) (Oral)   Resp 16  Ht 5\' 2"  (1.575 m)   Wt 173 lb (78.5 kg)   SpO2 98%   BMI 31.64 kg/m  General:   Well developed, NAD, BMI noted. HEENT:  Normocephalic . Face symmetric, atraumatic Lungs:  CTA B Normal respiratory effort, no intercostal retractions, no accessory muscle use. Heart: RRR,  no murmur.  No pretibial edema bilaterally  Skin: Not pale. Not jaundice Neurologic:  alert & oriented X3.  Speech normal, gait appropriate for age and unassisted Psych--  Cognition and judgment appear intact.  Cooperative with normal attention span and concentration.  Behavior appropriate. No anxious or depressed appearing.      Assessment      Assessment DM: A1c 6.5   (2013) H/o Gout  H/o  Mild Tinnitus , reports he saw ENT before  Cataract surgery B PNM 02/2019, suspected Covid-19  PLAN: Dizziness: See last visit: Resolved Pneumonia: See last visit, follow-up chest x-ray improved, now asymptomatic HTN: Started BP medications, BP today 139/86.  At home please typically  140/77.  No change, recommend low-salt diet History of DM: Check A1c FLP Increased PSA: Next visit with urology 08-2019 per patient, on tamsulosin, essentially asymptomatic. RTC CPX 6 months

## 2019-04-28 NOTE — Progress Notes (Signed)
Pre visit review using our clinic review tool, if applicable. No additional management support is needed unless otherwise documented below in the visit note. 

## 2019-04-28 NOTE — Patient Instructions (Addendum)
Per our records you are due for an eye exam. Please contact your eye doctor to schedule an appointment. Please have them send copies of your office visit notes to [place]. Our fax number is [phone].    GO TO THE FRONT DESK Schedule your next appointment for a physical exam in 6 months    REGRESE EN 6 MESES, HAGA UNA CITA   TOMESE LA PRESION 2 O 3 VECES POR SEMANA    SU PRESION DEBE SER ENTRE 110/65 Y  140/85.   PONGASE SU FLU SHOT ESTE AN~O

## 2019-04-29 NOTE — Assessment & Plan Note (Signed)
Dizziness: See last visit: Resolved Pneumonia: See last visit, follow-up chest x-ray improved, now asymptomatic HTN: Started BP medications, BP today 139/86.  At home please typically 140/77.  No change, recommend low-salt diet History of DM: Check A1c FLP Increased PSA: Next visit with urology 08-2019 per patient, on tamsulosin, essentially asymptomatic. RTC CPX 6 months

## 2019-05-04 ENCOUNTER — Other Ambulatory Visit: Payer: Self-pay

## 2019-05-04 DIAGNOSIS — E785 Hyperlipidemia, unspecified: Secondary | ICD-10-CM

## 2019-05-04 MED ORDER — ATORVASTATIN CALCIUM 20 MG PO TABS: 20.0000 mg | ORAL_TABLET | Freq: Every day | ORAL | 3 refills | Status: DC

## 2019-07-05 ENCOUNTER — Other Ambulatory Visit (INDEPENDENT_AMBULATORY_CARE_PROVIDER_SITE_OTHER): Payer: BC Managed Care – PPO

## 2019-07-05 ENCOUNTER — Other Ambulatory Visit: Payer: Self-pay

## 2019-07-05 DIAGNOSIS — E785 Hyperlipidemia, unspecified: Secondary | ICD-10-CM

## 2019-07-05 LAB — LIPID PANEL
Cholesterol: 127 mg/dL (ref 0–200)
HDL: 46.8 mg/dL (ref >39.00)
LDL Cholesterol: 60 mg/dL (ref 0–99)
NonHDL: 80.51
Total CHOL/HDL Ratio: 3
Triglycerides: 105 mg/dL (ref 0.0–149.0)
VLDL: 21 mg/dL (ref 0.0–40.0)

## 2019-07-05 LAB — AST: AST: 24 U/L (ref 0–37)

## 2019-07-05 LAB — ALT: ALT: 22 U/L (ref 0–53)

## 2019-07-07 ENCOUNTER — Ambulatory Visit: Payer: BC Managed Care – PPO | Admitting: Internal Medicine

## 2019-07-07 MED ORDER — ATORVASTATIN CALCIUM 20 MG PO TABS: 20.0000 mg | ORAL_TABLET | Freq: Every day | ORAL | 3 refills | Status: DC

## 2019-07-07 NOTE — Addendum Note (Signed)
Addended byDamita Dunnings D on: 07/07/2019 03:21 PM   Modules accepted: Orders

## 2019-07-26 DIAGNOSIS — Z20828 Contact with and (suspected) exposure to other viral communicable diseases: Secondary | ICD-10-CM | POA: Diagnosis not present

## 2019-08-09 ENCOUNTER — Other Ambulatory Visit: Payer: Self-pay | Admitting: Internal Medicine

## 2019-09-12 DIAGNOSIS — N5201 Erectile dysfunction due to arterial insufficiency: Secondary | ICD-10-CM | POA: Diagnosis not present

## 2019-09-12 DIAGNOSIS — R972 Elevated prostate specific antigen [PSA]: Secondary | ICD-10-CM | POA: Diagnosis not present

## 2019-11-25 IMAGING — DX PORTABLE CHEST - 1 VIEW
1 series · 1 of 1 positions shown · non-contrast
Comparison: None.

CLINICAL DATA: 63-year-old male with fever 1 week ago

EXAM:
PORTABLE CHEST 1 VIEW

[chest ap]
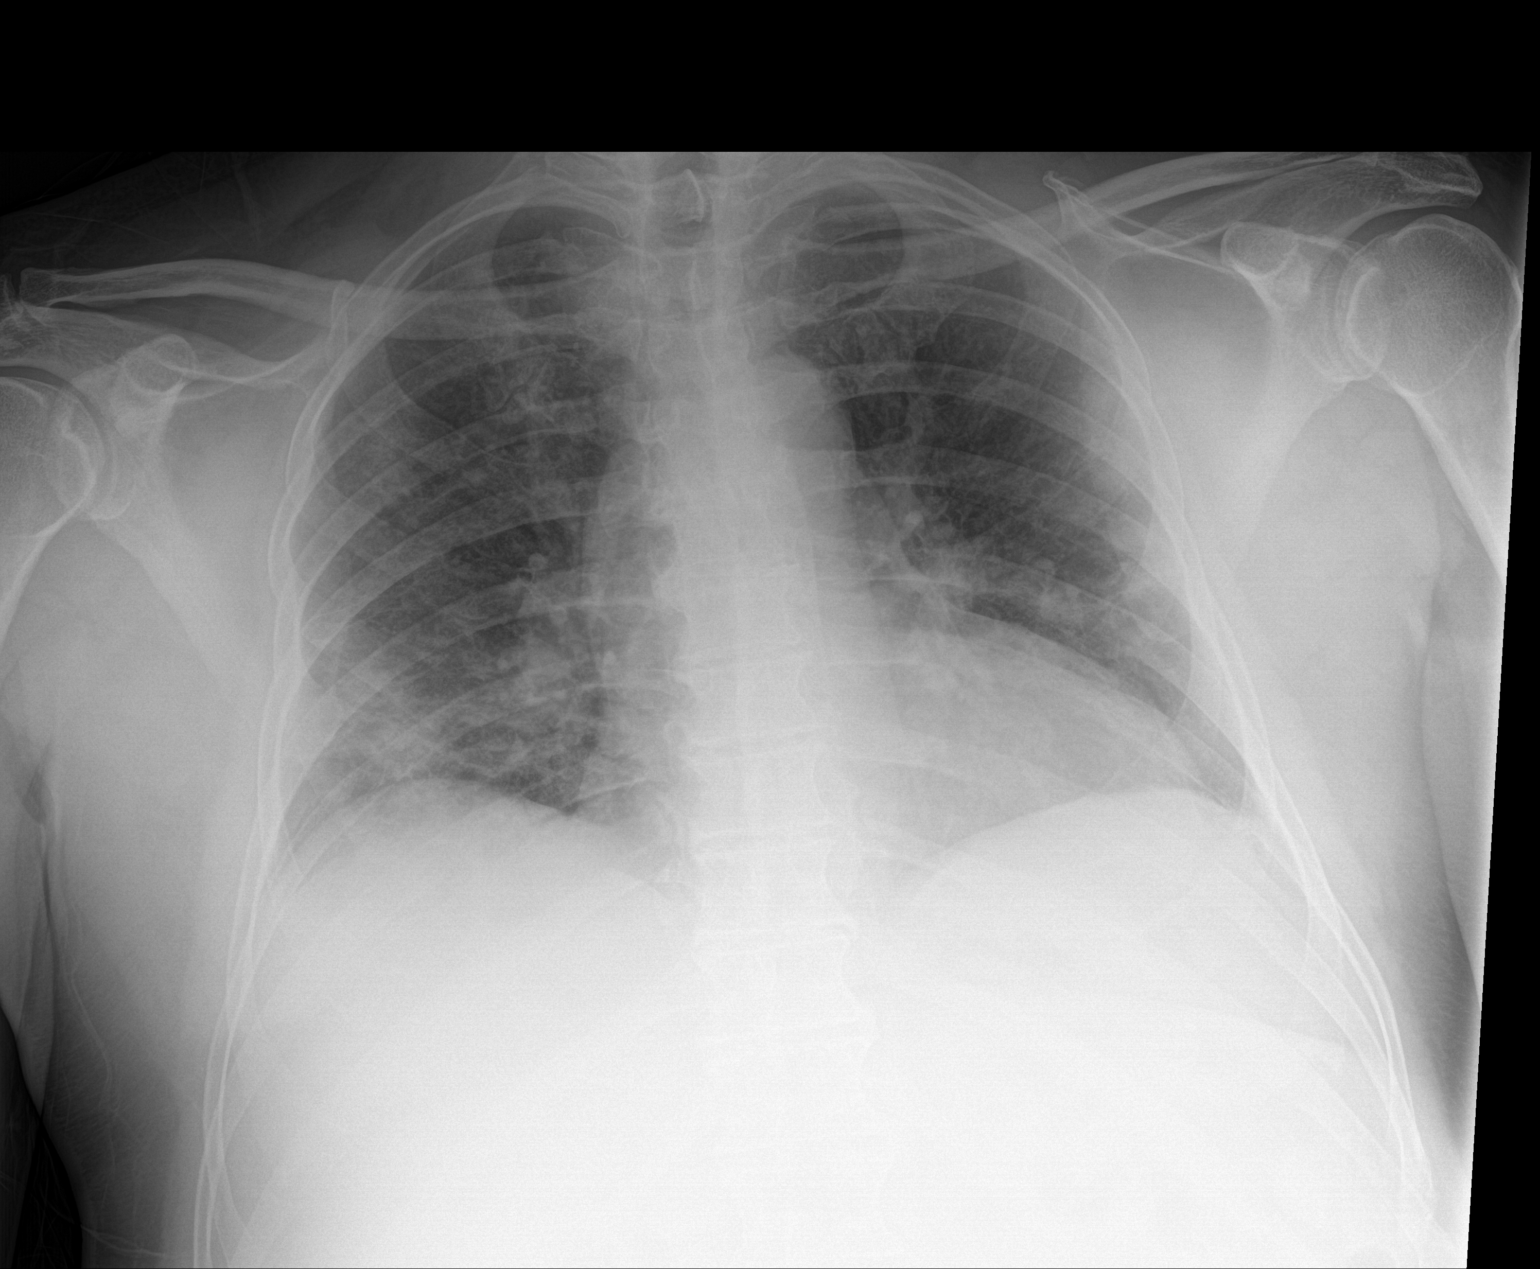

[1 of 1 positions shown; findings below may reference images not displayed]

FINDINGS: Cardiomediastinal silhouette within normal limits.

Patchy airspace opacity bilaterally. No pneumothorax. No large
pleural effusion. No interlobular septal thickening.
IMPRESSION: Bilateral airspace opacity, compatible with multifocal pneumonia
given the history.

## 2019-12-18 ENCOUNTER — Ambulatory Visit: Payer: Self-pay | Attending: Internal Medicine

## 2019-12-18 DIAGNOSIS — Z23 Encounter for immunization: Secondary | ICD-10-CM | POA: Insufficient documentation

## 2019-12-18 NOTE — Progress Notes (Signed)
   Covid-19 Vaccination Clinic  Name:  Christopher Guerra    MRN: [id number] DOB: April 22, 1956  12/18/2019  Christopher Guerra was observed post Covid-19 immunization for 15 minutes without incident. He was provided with Vaccine Information Sheet and instruction to access the V-Safe system.   Christopher Guerra was instructed to call 911 with any severe reactions post vaccine: Marland Kitchen Difficulty breathing  . Swelling of face and throat  . A fast heartbeat  . A bad rash all over body  . Dizziness and weakness   Immunizations Administered    Name Date Dose VIS Date Route   Pfizer COVID-19 Vaccine 12/18/2019  6:09 PM 0.3 mL 09/23/2019 Intramuscular   Manufacturer: [employer], [employer]   Lot: [id number]   NDC: [address]-3

## 2020-01-09 IMAGING — DX CHEST - 2 VIEW
2 series · 2 of 2 positions shown · non-contrast
Comparison: Chest radiograph 02/14/2019.

CLINICAL DATA: Patient with history of pneumonia.

EXAM:
CHEST - 2 VIEW

[chest pa]
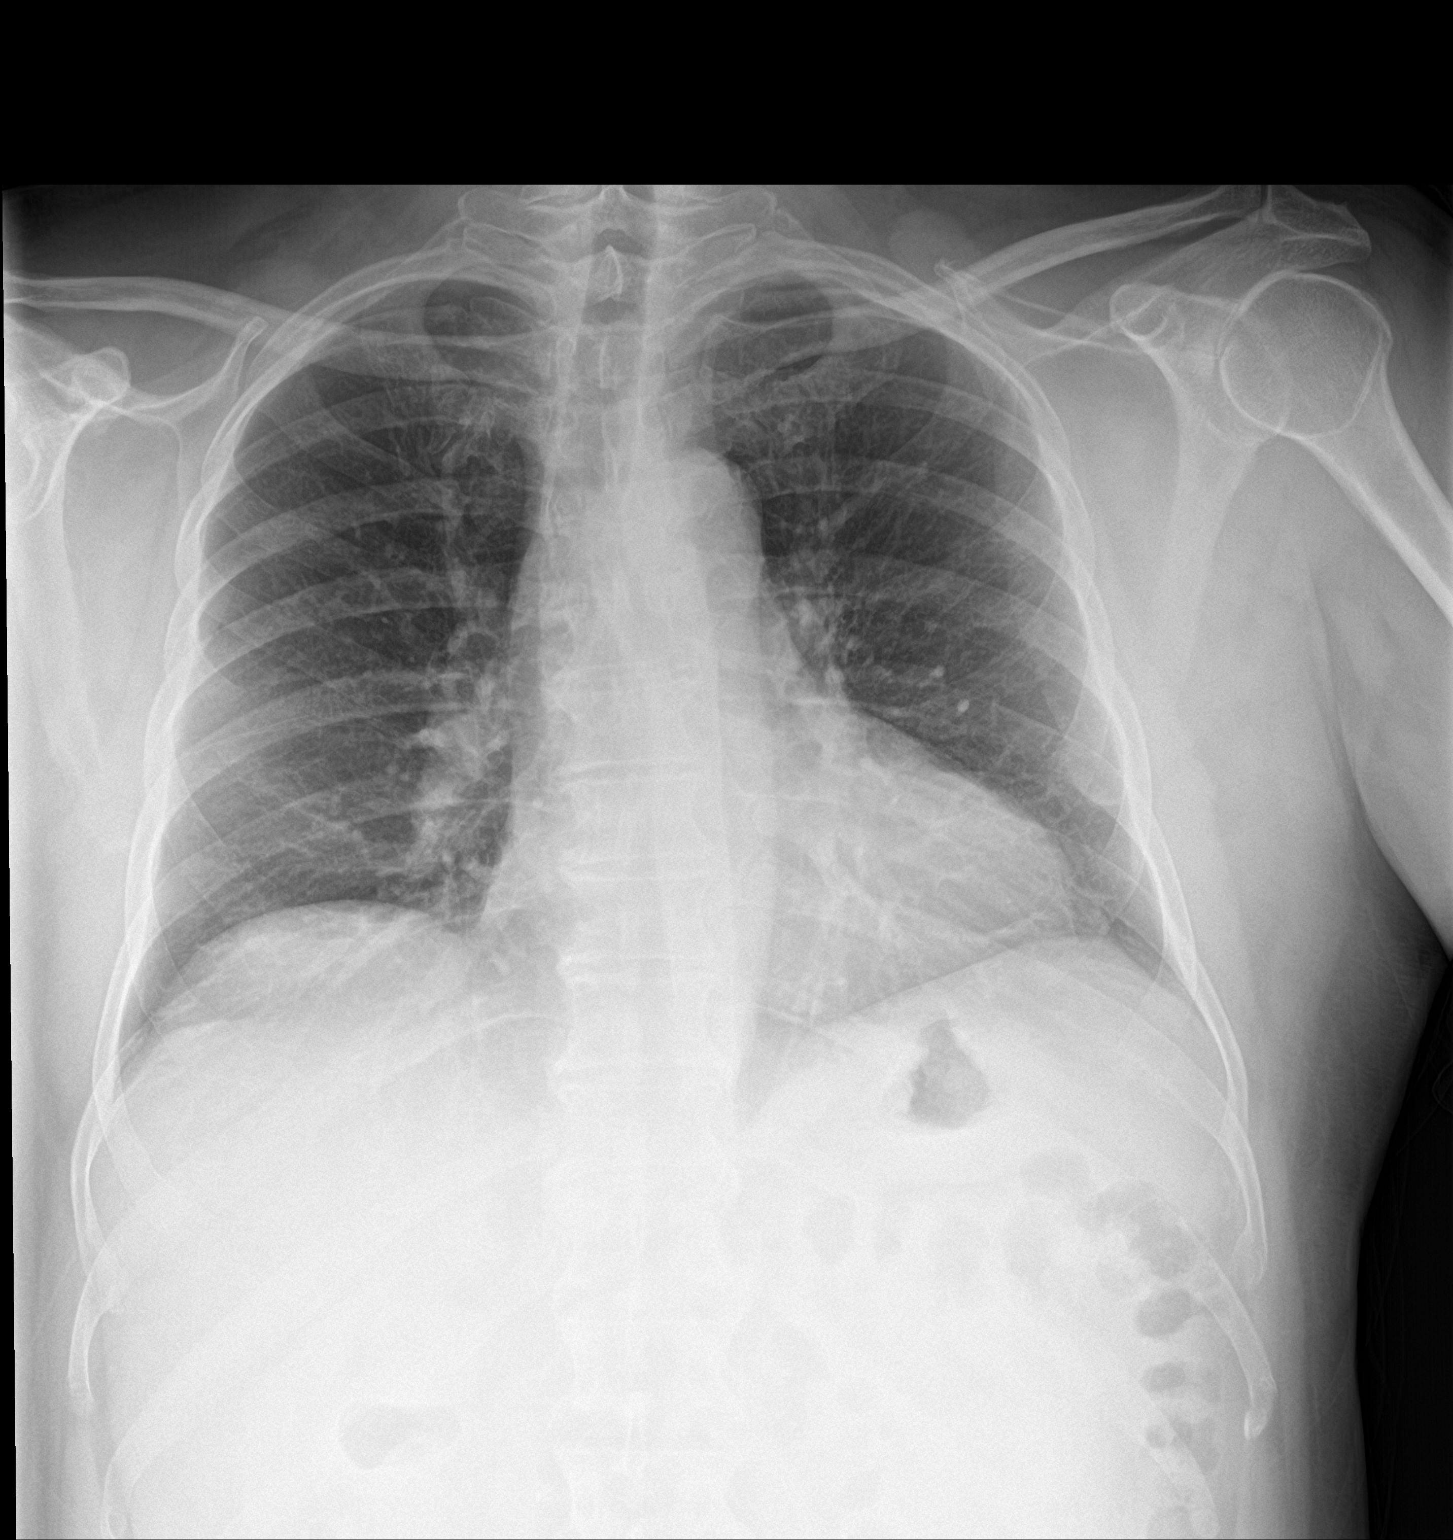

[chest lat]
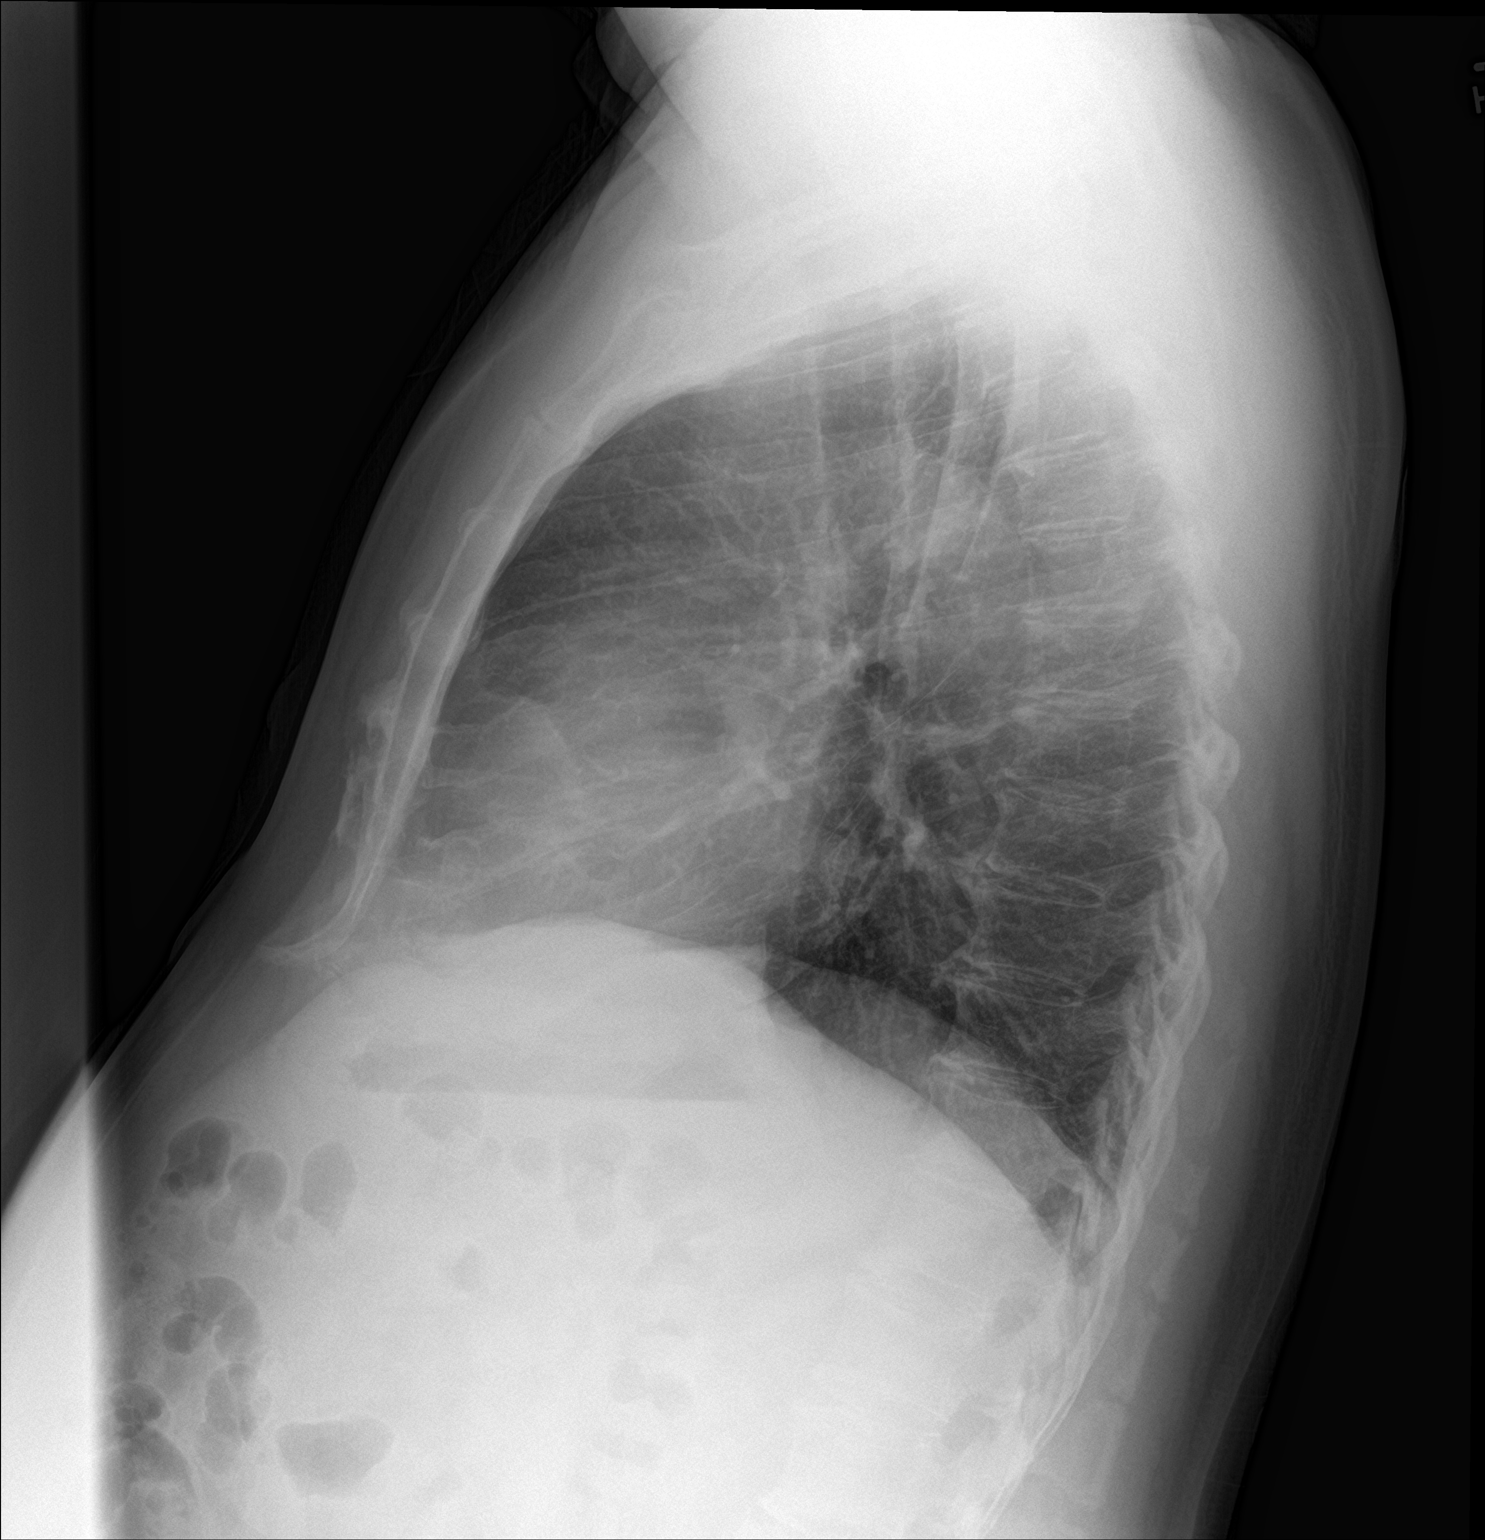

[2 of 2 positions shown; findings below may reference images not displayed]

FINDINGS: Stable cardiac and mediastinal contours. Elevation right
hemidiaphragm. Significant interval improvement in previously
described bilateral airspace opacities. No pleural effusion or
pneumothorax. Thoracic spine degenerative changes.
IMPRESSION: Significant interval improvement in previously described bilateral
consolidative opacities.

## 2020-01-18 ENCOUNTER — Ambulatory Visit: Payer: Self-pay | Attending: Internal Medicine

## 2020-01-18 DIAGNOSIS — Z23 Encounter for immunization: Secondary | ICD-10-CM

## 2020-01-18 NOTE — Progress Notes (Signed)
   Covid-19 Vaccination Clinic  Name:  Christopher Guerra    MRN: [id number] DOB: 1956-06-25  01/18/2020  Mr. Christopher Guerra was observed post Covid-19 immunization for 15 minutes without incident. He was provided with Vaccine Information Sheet and instruction to access the V-Safe system.   Mr. Christopher Guerra was instructed to call 911 with any severe reactions post vaccine: Marland Kitchen Difficulty breathing  . Swelling of face and throat  . A fast heartbeat  . A bad rash all over body  . Dizziness and weakness   Immunizations Administered    Name Date Dose VIS Date Route   Pfizer COVID-19 Vaccine 01/18/2020  8:54 AM 0.3 mL 09/23/2019 Intramuscular   Manufacturer: [employer], [employer]   Lot: [id number]   NDC: [address]-9

## 2021-01-18 ENCOUNTER — Ambulatory Visit (INDEPENDENT_AMBULATORY_CARE_PROVIDER_SITE_OTHER): Payer: Medicare Other | Admitting: Internal Medicine

## 2021-01-18 ENCOUNTER — Other Ambulatory Visit: Payer: Self-pay

## 2021-01-18 ENCOUNTER — Encounter: Payer: Self-pay | Admitting: Internal Medicine

## 2021-01-18 VITALS — BP 152/108 | HR 57 | Temp 98.1°F | Resp 16 | Ht 62.0 in | Wt 176.0 lb

## 2021-01-18 DIAGNOSIS — E119 Type 2 diabetes mellitus without complications: Secondary | ICD-10-CM | POA: Diagnosis not present

## 2021-01-18 DIAGNOSIS — R972 Elevated prostate specific antigen [PSA]: Secondary | ICD-10-CM

## 2021-01-18 DIAGNOSIS — I10 Essential (primary) hypertension: Secondary | ICD-10-CM

## 2021-01-18 DIAGNOSIS — Z23 Encounter for immunization: Secondary | ICD-10-CM | POA: Diagnosis not present

## 2021-01-18 DIAGNOSIS — E785 Hyperlipidemia, unspecified: Secondary | ICD-10-CM

## 2021-01-18 LAB — COMPREHENSIVE METABOLIC PANEL
ALT: 23 U/L (ref 0–53)
AST: 23 U/L (ref 0–37)
Albumin: 4.3 g/dL (ref 3.5–5.2)
Alkaline Phosphatase: 66 U/L (ref 39–117)
BUN: 14 mg/dL (ref 6–23)
CO2: 28 mEq/L (ref 19–32)
Calcium: 9.3 mg/dL (ref 8.4–10.5)
Chloride: 101 mEq/L (ref 96–112)
Creatinine, Ser: 0.95 mg/dL (ref 0.40–1.50)
GFR: 84.17 mL/min (ref >60.00)
Glucose, Bld: 99 mg/dL (ref 70–99)
Potassium: 4.3 mEq/L (ref 3.5–5.1)
Sodium: 137 mEq/L (ref 135–145)
Total Bilirubin: 0.6 mg/dL (ref 0.2–1.2)
Total Protein: 7.7 g/dL (ref 6.0–8.3)

## 2021-01-18 LAB — LIPID PANEL
Cholesterol: 216 mg/dL — ABNORMAL HIGH (ref 0–200)
HDL: 45.1 mg/dL (ref >39.00)
LDL Cholesterol: 135 mg/dL — ABNORMAL HIGH (ref 0–99)
NonHDL: 170.5
Total CHOL/HDL Ratio: 5
Triglycerides: 178 mg/dL — ABNORMAL HIGH (ref 0.0–149.0)
VLDL: 35.6 mg/dL (ref 0.0–40.0)

## 2021-01-18 LAB — CBC WITH DIFFERENTIAL/PLATELET
Basophils Absolute: 0 10*3/uL (ref 0.0–0.1)
Basophils Relative: 0.5 % (ref 0.0–3.0)
Eosinophils Absolute: 0 10*3/uL (ref 0.0–0.7)
Eosinophils Relative: 0.4 % (ref 0.0–5.0)
HCT: 43.1 % (ref 39.0–52.0)
Hemoglobin: 14.2 g/dL (ref 13.0–17.0)
Lymphocytes Relative: 37.4 % (ref 12.0–46.0)
Lymphs Abs: 3.1 10*3/uL (ref 0.7–4.0)
MCHC: 33 g/dL (ref 30.0–36.0)
MCV: 87.9 fl (ref 78.0–100.0)
Monocytes Absolute: 0.6 10*3/uL (ref 0.1–1.0)
Monocytes Relative: 7.3 % (ref 3.0–12.0)
Neutro Abs: 4.5 10*3/uL (ref 1.4–7.7)
Neutrophils Relative %: 54.4 % (ref 43.0–77.0)
Platelets: 217 10*3/uL (ref 150.0–400.0)
RBC: 4.9 Mil/uL (ref 4.22–5.81)
RDW: 14.3 % (ref 11.5–15.5)
WBC: 8.2 10*3/uL (ref 4.0–10.5)

## 2021-01-18 LAB — MICROALBUMIN / CREATININE URINE RATIO
Creatinine,U: 130.5 mg/dL
Microalb Creat Ratio: 0.8 mg/g (ref 0.0–30.0)
Microalb, Ur: 1.1 mg/dL (ref 0.0–1.9)

## 2021-01-18 LAB — HEMOGLOBIN A1C: Hgb A1c MFr Bld: 6.5 % (ref 4.6–6.5)

## 2021-01-18 MED ORDER — AMLODIPINE BESYLATE 5 MG PO TABS: 5.0000 mg | ORAL_TABLET | Freq: Every day | ORAL | 1 refills | Status: DC

## 2021-01-18 NOTE — Patient Instructions (Addendum)
Per our records you are due for an eye exam. Please contact your eye doctor to schedule an appointment. Please have them send copies of your office visit notes to [place]. Our fax number is [phone].   Tome amlodipine 5 mg una vez al dia tomese la presion 2 veces x semana Debe ser entre  110/65 and  135/85.  llame al urologu y haga una cita    Antes de irse tomese la prueba de sangre y  haga una cita conmigo y regrese en 3 meses (fasting)   GO TO THE LAB : Get the blood work     GO TO THE FRONT DESK, PLEASE SCHEDULE YOUR APPOINTMENTS Come back for a checkup in 3 months

## 2021-01-18 NOTE — Progress Notes (Signed)
Subjective:    Patient ID: Christopher Guerra, male    DOB: 1956/07/06, 65 y.o.   MRN: [id number]  DOS:  01/18/2021 Type of visit - description: Follow-up, last visit 04-2019 Since the last office he is not taking cholesterol or BP medicines. Has not seen urology recently. Complaining of tinnitus, this is a long-term problem, getting slightly worse lately. Denies any HOH, no ear pain.  Symptoms are worse at night.  BP Readings from Last 3 Encounters:  01/18/21 (!) 152/108  04/28/19 139/86  03/31/19 (!) 166/92     Review of Systems Denies chest pain no difficulty breathing.  No DOE with normal activities. No dysuria, gross hematuria or difficulty urinating.   Past Medical History:  Diagnosis Date  . Diabetes mellitus without complication (HCC)   . Elevated PSA   . Gout    ? of gout  . Tinnitus     Past Surgical History:  Procedure Laterality Date  . CATARACT EXTRACTION Bilateral 2015    Allergies as of 01/18/2021   No Known Allergies     Medication List       Accurate as of January 18, 2021 11:59 PM. If you have any questions, ask your nurse or doctor.        STOP taking these medications   atorvastatin 20 MG tablet Commonly known as: LIPITOR Stopped by: Willow Ora, MD     TAKE these medications   amLODipine 5 MG tablet Commonly known as: NORVASC Take 1 tablet (5 mg total) by mouth daily.   tamsulosin 0.4 MG Caps capsule Commonly known as: FLOMAX Take 0.4 mg by mouth.          Objective:   Physical Exam BP (!) 152/108 (BP Location: Left Arm, Patient Position: Sitting, Cuff Size: Small)   Pulse (!) 57   Temp 98.1 F (36.7 C) (Oral)   Resp 16   Ht 5\' 2"  (1.575 m)   Wt 176 lb (79.8 kg)   SpO2 96%   BMI 32.19 kg/m  General:   Well developed, NAD, BMI noted.  HEENT:  Normocephalic . Face symmetric, atraumatic Lungs:  CTA B Normal respiratory effort, no intercostal retractions, no accessory muscle use. Heart: RRR,  no murmur.  Abdomen:  Not  distended, soft, non-tender. No rebound or rigidity.   Skin: Not pale. Not jaundice Lower extremities: no pretibial edema bilaterally  Neurologic:  alert & oriented X3.  Speech normal, gait appropriate for age and unassisted Psych--  Cognition and judgment appear intact.  Cooperative with normal attention span and concentration.  Behavior appropriate. No anxious or depressed appearing.     Assessment      Assessment DM: A1c 6.5   (2013) H/o Gout  H/o  Mild Tinnitus , reports he saw ENT before  Cataract surgery B PNM 02/2019, suspected Covid-19 PSA elevated    PLAN: Last visit 04-2019 DM: Diet controlled, check A1c, micro HTN: Used to be on amlodipine, has not taken it for a while, check CMP, CBC. EKG: NSR, RBBB (new compared to 2016), he is asx. Restart amlodipine, recommend to monitor BPs. Patient is now aware that this will be a permanent medication. High cholesterol: At some point was prescribed Lipitor, recheck FLP.  Again if statins are prescribed they will be permanent medication. Increased PSA Last available note from urology is from 2018, at that time PSA was 5.85.  Refer to urology Tinnitus: Reassess on RTC Preventive care: PNM 13 today All instructions were discussed in Spanish RTC  2 to 3 months fasting.   This visit occurred during the SARS-CoV-2 public health emergency.  Safety protocols were in place, including screening questions prior to the visit, additional usage of staff PPE, and extensive cleaning of exam room while observing appropriate contact time as indicated for disinfecting solutions.

## 2021-01-19 NOTE — Assessment & Plan Note (Signed)
Last visit 04-2019 DM: Diet controlled, check A1c, micro HTN: Used to be on amlodipine, has not taken it for a while, check CMP, CBC. EKG: NSR, RBBB (new compared to 2016), he is asx. Restart amlodipine, recommend to monitor BPs. Patient is now aware that this will be a permanent medication. High cholesterol: At some point was prescribed Lipitor, recheck FLP.  Again if statins are prescribed they will be permanent medication. Increased PSA Last available note from urology is from 2018, at that time PSA was 5.85.  Refer to urology Tinnitus: Reassess on RTC Preventive care: PNM 13 today All instructions were discussed in Spanish RTC 2 to 3 months fasting.

## 2021-01-23 ENCOUNTER — Other Ambulatory Visit: Payer: Self-pay

## 2021-01-23 MED ORDER — ATORVASTATIN CALCIUM 20 MG PO TABS: 20.0000 mg | ORAL_TABLET | Freq: Every day | ORAL | 1 refills | Status: DC

## 2021-04-19 ENCOUNTER — Ambulatory Visit: Payer: Medicare Other | Admitting: Internal Medicine

## 2021-04-23 ENCOUNTER — Ambulatory Visit: Payer: Medicare Other | Attending: Internal Medicine

## 2021-04-23 ENCOUNTER — Other Ambulatory Visit: Payer: Self-pay

## 2021-04-23 ENCOUNTER — Ambulatory Visit (INDEPENDENT_AMBULATORY_CARE_PROVIDER_SITE_OTHER): Payer: Medicare Other | Admitting: Internal Medicine

## 2021-04-23 ENCOUNTER — Encounter: Payer: Self-pay | Admitting: Internal Medicine

## 2021-04-23 VITALS — BP 146/82 | HR 71 | Temp 98.1°F | Resp 16 | Ht 62.0 in | Wt 180.4 lb

## 2021-04-23 DIAGNOSIS — H9313 Tinnitus, bilateral: Secondary | ICD-10-CM

## 2021-04-23 DIAGNOSIS — E785 Hyperlipidemia, unspecified: Secondary | ICD-10-CM

## 2021-04-23 DIAGNOSIS — H04123 Dry eye syndrome of bilateral lacrimal glands: Secondary | ICD-10-CM

## 2021-04-23 DIAGNOSIS — I10 Essential (primary) hypertension: Secondary | ICD-10-CM | POA: Diagnosis not present

## 2021-04-23 DIAGNOSIS — E119 Type 2 diabetes mellitus without complications: Secondary | ICD-10-CM

## 2021-04-23 DIAGNOSIS — Z23 Encounter for immunization: Secondary | ICD-10-CM

## 2021-04-23 NOTE — Progress Notes (Signed)
   Subjective:    Patient ID: Christopher Guerra, male    DOB: 1956-01-17, 65 y.o.   MRN: [id number]  DOS:  04/23/2021 Type of visit - description: Follow-up  Since the last office visit he is taking his medication correctly. Continue with bilateral tinnitus without HOH. Having some difficulty with his eyes, have cataract surgery before.  Unable to see a ophthalmologist lately  Review of Systems See above   Past Medical History:  Diagnosis Date   Diabetes mellitus without complication (HCC)    Elevated PSA    Gout    ? of gout   Tinnitus     Past Surgical History:  Procedure Laterality Date   CATARACT EXTRACTION Bilateral 2015    Allergies as of 04/23/2021   No Known Allergies      Medication List        Accurate as of April 23, 2021 11:59 PM. If you have any questions, ask your nurse or doctor.          amLODipine 5 MG tablet Commonly known as: NORVASC Take 1 tablet (5 mg total) by mouth daily.   atorvastatin 20 MG tablet Commonly known as: LIPITOR Take 1 tablet (20 mg total) by mouth at bedtime.   tamsulosin 0.4 MG Caps capsule Commonly known as: FLOMAX Take 0.4 mg by mouth.           Objective:   Physical Exam BP (!) 146/82 (BP Location: Left Arm, Patient Position: Sitting, Cuff Size: Small)   Pulse 71   Temp 98.1 F (36.7 C) (Oral)   Resp 16   Ht 5\' 2"  (1.575 m)   Wt 180 lb 6 oz (81.8 kg)   SpO2 97%   BMI 32.99 kg/m  General:   Well developed, NAD, BMI noted. HEENT:  Normocephalic . Face symmetric, atraumatic Lungs:  CTA B Normal respiratory effort, no intercostal retractions, no accessory muscle use. Heart: RRR,  no murmur.  Lower extremities: no pretibial edema bilaterally  Skin: Not pale. Not jaundice Neurologic:  alert & oriented X3.  Speech normal, gait appropriate for age and unassisted Psych--  Cognition and judgment appear intact.  Cooperative with normal attention span and concentration.  Behavior appropriate. No  anxious or depressed appearing.      Assessment     Assessment DM: A1c 6.5   (2013) HTN H/o Gout  H/o  Mild Tinnitus , reports he saw ENT before  Cataract surgery B PNM 02/2019, suspected Covid-19 PSA elevated   PLAN: DM: Last A1c very good, diet controlled.  Refer to ophthalmology (history of DM, history of cataract surgery). HTN: Good compliance with amlodipine, trace edema LE noted today, BP not at goal, today is slt high here, at home is mostly in the 140s and occasionally 130s.  Rec to switch to losartan HCT, patient declined, states that cost is an issue for him (won't be able to get a new supply of medications for few months). Plan: Reassess on RTC Tinnitus: Ongoing issue, request a ENT referral, will arrange, he knows treatment options are limited if any. Hyperlipidemia: Started atorvastatin, check labs today. Preventive care reviewed RTC 4 months    This visit occurred during the SARS-CoV-2 public health emergency.  Safety protocols were in place, including screening questions prior to the visit, additional usage of staff PPE, and extensive cleaning of exam room while observing appropriate contact time as indicated for disinfecting solutions.

## 2021-04-23 NOTE — Patient Instructions (Addendum)
We have placed a referral to [facility] for a diabetic eye exam. Please expect a call in the next several days to schedule an appointment.     Check the  blood pressure weekly  BP GOAL is between 110/65 and  135/85. If it is consistently higher or lower, let me know   GO TO THE LAB : Get the blood work     GO TO THE FRONT DESK, PLEASE SCHEDULE YOUR APPOINTMENTS Come back for   for a check up in 4 months        "Living will", "Health Care Power of attorney": Advanced care planning  (If you already have a living will or healthcare power of attorney, please bring the copy to be scanned in your chart.)  Advance care planning is a process that supports adults in  understanding and sharing their preferences regarding future medical care.   The patient's preferences are recorded in documents called Advance Directives.    Advanced directives are completed (and can be modified at any time) while the patient is in full mental capacity.   The documentation should be available at all times to the patient, the family and the healthcare providers.  Bring in a copy to be scanned in your chart is an excellent idea and is recommended   This legal documents direct treatment decision making and/or appoint a surrogate to make the decision if the patient is not capable to do so.    Advance directives can be documented in many types of formats,  documents have names such as:  Lliving will  Durable power of attorney for healthcare (healthcare proxy or healthcare power of attorney)  Combined directives  Physician orders for life-sustaining treatment    More information at:  [email or web address]

## 2021-04-23 NOTE — Progress Notes (Signed)
   Covid-19 Vaccination Clinic  Name:  [name]    MRN: [id number] DOB: 08-16-1956  04/23/2021  [name] was observed post Covid-19 immunization for 15 minutes without incident. He was provided with Vaccine Information Sheet and instruction to access the V-Safe system.   [name] was instructed to call 911 with any severe reactions post vaccine: Difficulty breathing  Swelling of face and throat  A fast heartbeat  A bad rash all over body  Dizziness and weakness   Immunizations Administered     Name Date Dose VIS Date Route   PFIZER Comrnaty(Gray TOP) Covid-19 Vaccine 04/23/2021  2:53 PM 0.3 mL 09/20/2020 Intramuscular   Manufacturer: [employer], [employer]   Lot: [id number]   NDC: [phone]

## 2021-04-24 LAB — LIPID PANEL
Cholesterol: 163 mg/dL (ref 0–200)
HDL: 51.2 mg/dL (ref >39.00)
NonHDL: 111.84
Total CHOL/HDL Ratio: 3
Triglycerides: 210 mg/dL — ABNORMAL HIGH (ref 0.0–149.0)
VLDL: 42 mg/dL — ABNORMAL HIGH (ref 0.0–40.0)

## 2021-04-24 LAB — AST: AST: 26 U/L (ref 0–37)

## 2021-04-24 LAB — ALT: ALT: 23 U/L (ref 0–53)

## 2021-04-24 LAB — LDL CHOLESTEROL, DIRECT: Direct LDL: 91 mg/dL

## 2021-04-24 NOTE — Assessment & Plan Note (Signed)
DM: Last A1c very good, diet controlled.  Refer to ophthalmology (history of DM, history of cataract surgery). HTN: Good compliance with amlodipine, trace edema LE noted today, BP not at goal, today is slt high here, at home is mostly in the 140s and occasionally 130s.  Rec to switch to losartan HCT, patient declined, states that cost is an issue for him (won't be able to get a new supply of medications for few months). Plan: Reassess on RTC Tinnitus: Ongoing issue, request a ENT referral, will arrange, he knows treatment options are limited if any. Hyperlipidemia: Started atorvastatin, check labs today. Preventive care reviewed RTC 4 months

## 2021-04-24 NOTE — Assessment & Plan Note (Signed)
Td 2012; declined booster today PNM 13: 01/2021, will need PNM 23 next year  COVID VAX x3, booster recommended Prostate cancer screening: Saw urology last month, prostate exam was reassuring, PSA was 3.3   Cscope 10-2012 neg, 10 years POA: Discussed with patient

## 2021-04-26 ENCOUNTER — Other Ambulatory Visit (HOSPITAL_BASED_OUTPATIENT_CLINIC_OR_DEPARTMENT_OTHER): Payer: Self-pay

## 2021-04-26 MED ORDER — ATORVASTATIN CALCIUM 20 MG PO TABS: 20.0000 mg | ORAL_TABLET | Freq: Every day | ORAL | 3 refills | Status: DC

## 2021-04-26 MED ORDER — COVID-19 MRNA VAC-TRIS(PFIZER) 30 MCG/0.3ML IM SUSP
INTRAMUSCULAR | 0 refills | Status: DC
  Filled 2021-04-26: qty 0.3, 1d supply, fill #0

## 2021-04-26 NOTE — Addendum Note (Signed)
Addended by: Laine Giovanetti D on: 04/26/2021 07:40 AM   Modules accepted: Orders  

## 2021-05-07 ENCOUNTER — Telehealth: Payer: Self-pay

## 2021-05-07 DIAGNOSIS — H04129 Dry eye syndrome of unspecified lacrimal gland: Secondary | ICD-10-CM

## 2021-05-07 NOTE — Telephone Encounter (Signed)
We did talk about a ophthalmology referral when he was here 04/23/2021, our referral was placed. Okay to place another referral.  Thank you. [id number] of diabetes history of cataract surgery, dry eye

## 2021-05-07 NOTE — Telephone Encounter (Signed)
Patient requesting referral to this [facility] center. This is for dry eye.  Please advise. Order created as patient requested and pended for pcp review and approval.

## 2021-05-08 NOTE — Telephone Encounter (Signed)
Patient advised, he reports he is scheduled to see them in August when his insurance changes to [facility]

## 2021-05-08 NOTE — Telephone Encounter (Signed)
Gwen checked on the referral she stated "[facility] is where the referral was sent pt can call to schedule [phone]."  Pt is spanish speaking- do you mind relaying this message to the pt?

## 2021-05-20 DIAGNOSIS — H5211 Myopia, right eye: Secondary | ICD-10-CM | POA: Diagnosis not present

## 2021-05-20 DIAGNOSIS — H52203 Unspecified astigmatism, bilateral: Secondary | ICD-10-CM | POA: Diagnosis not present

## 2021-05-20 DIAGNOSIS — H04123 Dry eye syndrome of bilateral lacrimal glands: Secondary | ICD-10-CM | POA: Diagnosis not present

## 2021-05-20 DIAGNOSIS — H26493 Other secondary cataract, bilateral: Secondary | ICD-10-CM | POA: Diagnosis not present

## 2021-05-20 LAB — HM DIABETES EYE EXAM

## 2021-06-04 ENCOUNTER — Ambulatory Visit (INDEPENDENT_AMBULATORY_CARE_PROVIDER_SITE_OTHER): Payer: Medicare Other | Admitting: Otolaryngology

## 2021-06-16 DIAGNOSIS — Z1152 Encounter for screening for COVID-19: Secondary | ICD-10-CM | POA: Diagnosis not present

## 2021-06-18 ENCOUNTER — Other Ambulatory Visit (HOSPITAL_BASED_OUTPATIENT_CLINIC_OR_DEPARTMENT_OTHER): Payer: Self-pay

## 2021-07-11 ENCOUNTER — Telehealth: Payer: Self-pay | Admitting: Internal Medicine

## 2021-07-11 NOTE — Telephone Encounter (Signed)
Please inquire about aches with atorvastatin.  If that is the case we could try something different. By the way, he is diabetic

## 2021-07-11 NOTE — Telephone Encounter (Signed)
Enrique Sack the NP from [facility] is calling to make Dr. Drue Novel aware that the patient's A1C is 6.7 and he is not a diabetic. She also states that his atorvastatin is giving him headaches and muscle aches.

## 2021-07-11 NOTE — Telephone Encounter (Signed)
FYI

## 2021-07-11 NOTE — Telephone Encounter (Signed)
Spoke w/ Pt- he states since starting atorvastatin has been having muscle aches and headaches, he doesn't take the atorvastatin daily d/t this. Sometimes he will skip the medication 2-3 days weekly. Not worse headache of life. Informed I'd let PCP know and call back with recommendations.

## 2021-07-14 ENCOUNTER — Other Ambulatory Visit: Payer: Self-pay | Admitting: Internal Medicine

## 2021-07-15 MED ORDER — PRAVASTATIN SODIUM 20 MG PO TABS: 20.0000 mg | ORAL_TABLET | Freq: Every day | ORAL | 4 refills | Status: DC

## 2021-07-15 NOTE — Telephone Encounter (Signed)
Tried calling Pt- call went straight to voicemail which has not been set up yet. Will try again later.

## 2021-07-15 NOTE — Telephone Encounter (Signed)
Pt called back- informed of recommendations. Pt verbalized understanding. Pravachol sent to [facility].

## 2021-07-15 NOTE — Telephone Encounter (Signed)
Plan: Stop atorvastatin Pravachol 20 mg 1 p.o. nightly, send a 21-month supply. Follow-up with me in few months as recommended If the headaches, aches and pains do not get better definitely let me know sooner

## 2021-07-19 ENCOUNTER — Other Ambulatory Visit: Payer: Self-pay

## 2021-07-19 ENCOUNTER — Encounter: Payer: Self-pay | Admitting: Internal Medicine

## 2021-07-19 ENCOUNTER — Ambulatory Visit (INDEPENDENT_AMBULATORY_CARE_PROVIDER_SITE_OTHER): Payer: Medicare Other | Admitting: Internal Medicine

## 2021-07-19 VITALS — BP 140/75 | HR 68 | Temp 98.2°F | Resp 16 | Ht 62.0 in | Wt 176.0 lb

## 2021-07-19 DIAGNOSIS — I10 Essential (primary) hypertension: Secondary | ICD-10-CM | POA: Diagnosis not present

## 2021-07-19 DIAGNOSIS — E119 Type 2 diabetes mellitus without complications: Secondary | ICD-10-CM

## 2021-07-19 NOTE — Patient Instructions (Addendum)
Check the  blood pressure every week BP GOAL is between 110/65 and  135/85. If it is consistently higher or lower, let me know     GO TO THE LAB : Get the blood work     GO TO THE FRONT DESK, PLEASE SCHEDULE YOUR APPOINTMENTS Come back for a checkup in 6 months

## 2021-07-19 NOTE — Progress Notes (Signed)
   Subjective:    Patient ID: Christopher Guerra, male    DOB: [birth year], 65 y.o.   MRN: [id number]  DOS:  07/19/2021 Type of visit - description: Follow-up  Had a nurse visit at home recently, 07/11/2021: A1c 6.7. PVD screening negative BP 128/80  Here for a checkup.  Review of Systems Denies lower extremity paresthesias. Gained wt recently but is already working on it.   Past Medical History:  Diagnosis Date   Diabetes mellitus without complication (HCC)    Elevated PSA    Gout    ? of gout   Tinnitus     Past Surgical History:  Procedure Laterality Date   CATARACT EXTRACTION Bilateral 2015    Allergies as of 07/19/2021   No Known Allergies      Medication List        Accurate as of July 19, 2021  3:29 PM. If you have any questions, ask your nurse or doctor.          STOP taking these medications    Pf[employer]-Bio[employer] COVID-19 Vac-TriS Susp injection Generic drug: COVID-19 mRNA Vac-TriS Proofreader) Stopped by: Willow Ora, MD       TAKE these medications    amLODipine 5 MG tablet Commonly known as: NORVASC TAKE 1 TABLET(5 MG) BY MOUTH DAILY   pravastatin 20 MG tablet Commonly known as: PRAVACHOL Take 1 tablet (20 mg total) by mouth at bedtime.   tamsulosin 0.4 MG Caps capsule Commonly known as: FLOMAX Take 0.4 mg by mouth.           Objective:   Physical Exam BP (!) 152/82 (BP Location: Left Arm, Patient Position: Sitting, Cuff Size: Small)   Pulse 68   Temp 98.2 F (36.8 C) (Oral)   Resp 16   Ht 5\' 2"  (1.575 m)   Wt 176 lb (79.8 kg)   SpO2 98%   BMI 32.19 kg/m  General:   Well developed, NAD, BMI noted. HEENT:  Normocephalic . Face symmetric, atraumatic Lungs:  CTA B Normal respiratory effort, no intercostal retractions, no accessory muscle use. Heart: RRR,  no murmur.  DM foot exam: No edema, good pedal pulses, pinprick examination normal Skin: Not pale. Not jaundice Neurologic:  alert & oriented X3.  Speech normal, gait  appropriate for age and unassisted Psych--  Cognition and judgment appear intact.  Cooperative with normal attention span and concentration.  Behavior appropriate. No anxious or depressed appearing.      Assessment    Assessment DM: A1c 6.5   (2013) HTN H/o Gout  H/o  Mild Tinnitus , reports he saw ENT before  Cataract surgery B PNM 02/2019, suspected Covid-19 PSA elevated   PLAN: Had a nurse visit at home recently, 07/11/2021: A1c 6.7. PVD screening negative BP 128/80 DM: Diet controlled, feet exam normal, check A1c.  Did gain some weight lately but is watching his diet closely. HTN: On amlodipine, upon arrival BP was elevated, I recheck: 140/75.  Recently nurse check his BP at home and it was normal.  No change, monitor BPs. Preventive care: Declined flu and  COVID vaccines. RTC 6 months    This visit occurred during the SARS-CoV-2 public health emergency.  Safety protocols were in place, including screening questions prior to the visit, additional usage of staff PPE, and extensive cleaning of exam room while observing appropriate contact time as indicated for disinfecting solutions.

## 2021-07-20 LAB — HEMOGLOBIN A1C
Hgb A1c MFr Bld: 6.2 % of total Hgb — ABNORMAL HIGH (ref <5.7)
Mean Plasma Glucose: 131 mg/dL
eAG (mmol/L): 7.3 mmol/L

## 2021-07-21 NOTE — Assessment & Plan Note (Signed)
Had a nurse visit at home recently, 07/11/2021: A1c 6.7. PVD screening negative BP 128/80 DM: Diet controlled, feet exam normal, check A1c.  Did gain some weight lately but is watching his diet closely. HTN: On amlodipine, upon arrival BP was elevated, I recheck: 140/75.  Recently nurse check his BP at home and it was normal.  No change, monitor BPs. Preventive care: Declined flu and  COVID vaccines. RTC 6 months

## 2021-08-23 ENCOUNTER — Ambulatory Visit: Payer: Medicare Other | Admitting: Internal Medicine

## 2022-01-03 ENCOUNTER — Encounter: Payer: Self-pay | Admitting: Internal Medicine

## 2022-01-16 ENCOUNTER — Other Ambulatory Visit: Payer: Self-pay | Admitting: Internal Medicine

## 2022-01-16 MED ORDER — PRAVASTATIN SODIUM 20 MG PO TABS: 20.0000 mg | ORAL_TABLET | Freq: Every day | ORAL | 4 refills | Status: DC

## 2022-01-17 ENCOUNTER — Ambulatory Visit: Payer: Medicare Other | Admitting: Internal Medicine

## 2022-02-06 ENCOUNTER — Encounter: Payer: Self-pay | Admitting: Internal Medicine

## 2022-02-06 ENCOUNTER — Ambulatory Visit (INDEPENDENT_AMBULATORY_CARE_PROVIDER_SITE_OTHER): Payer: Medicare Other | Admitting: Internal Medicine

## 2022-02-06 VITALS — BP 136/82 | HR 63 | Temp 98.0°F | Resp 16 | Ht 62.0 in | Wt 180.1 lb

## 2022-02-06 DIAGNOSIS — E785 Hyperlipidemia, unspecified: Secondary | ICD-10-CM

## 2022-02-06 DIAGNOSIS — Z23 Encounter for immunization: Secondary | ICD-10-CM | POA: Diagnosis not present

## 2022-02-06 DIAGNOSIS — I10 Essential (primary) hypertension: Secondary | ICD-10-CM | POA: Diagnosis not present

## 2022-02-06 DIAGNOSIS — E119 Type 2 diabetes mellitus without complications: Secondary | ICD-10-CM | POA: Diagnosis not present

## 2022-02-06 DIAGNOSIS — M6208 Separation of muscle (nontraumatic), other site: Secondary | ICD-10-CM | POA: Diagnosis not present

## 2022-02-06 LAB — CBC WITH DIFFERENTIAL/PLATELET
Basophils Absolute: 0 10*3/uL (ref 0.0–0.1)
Basophils Relative: 0.3 % (ref 0.0–3.0)
Eosinophils Absolute: 0.1 10*3/uL (ref 0.0–0.7)
Eosinophils Relative: 1 % (ref 0.0–5.0)
HCT: 40.9 % (ref 39.0–52.0)
Hemoglobin: 13.3 g/dL (ref 13.0–17.0)
Lymphocytes Relative: 35.5 % (ref 12.0–46.0)
Lymphs Abs: 2.3 10*3/uL (ref 0.7–4.0)
MCHC: 32.6 g/dL (ref 30.0–36.0)
MCV: 87.7 fl (ref 78.0–100.0)
Monocytes Absolute: 0.7 10*3/uL (ref 0.1–1.0)
Monocytes Relative: 10.8 % (ref 3.0–12.0)
Neutro Abs: 3.3 10*3/uL (ref 1.4–7.7)
Neutrophils Relative %: 52.4 % (ref 43.0–77.0)
Platelets: 247 10*3/uL (ref 150.0–400.0)
RBC: 4.66 Mil/uL (ref 4.22–5.81)
RDW: 15.9 % — ABNORMAL HIGH (ref 11.5–15.5)
WBC: 6.4 10*3/uL (ref 4.0–10.5)

## 2022-02-06 LAB — COMPREHENSIVE METABOLIC PANEL
ALT: 24 U/L (ref 0–53)
AST: 29 U/L (ref 0–37)
Albumin: 4.1 g/dL (ref 3.5–5.2)
Alkaline Phosphatase: 78 U/L (ref 39–117)
BUN: 11 mg/dL (ref 6–23)
CO2: 25 mEq/L (ref 19–32)
Calcium: 8.6 mg/dL (ref 8.4–10.5)
Chloride: 105 mEq/L (ref 96–112)
Creatinine, Ser: 1.01 mg/dL (ref 0.40–1.50)
GFR: 77.63 mL/min (ref >60.00)
Glucose, Bld: 108 mg/dL — ABNORMAL HIGH (ref 70–99)
Potassium: 4.5 mEq/L (ref 3.5–5.1)
Sodium: 138 mEq/L (ref 135–145)
Total Bilirubin: 0.5 mg/dL (ref 0.2–1.2)
Total Protein: 7.4 g/dL (ref 6.0–8.3)

## 2022-02-06 LAB — HEMOGLOBIN A1C: Hgb A1c MFr Bld: 6.5 % (ref 4.6–6.5)

## 2022-02-06 LAB — LIPID PANEL
Cholesterol: 225 mg/dL — ABNORMAL HIGH (ref 0–200)
HDL: 49.1 mg/dL (ref >39.00)
LDL Cholesterol: 152 mg/dL — ABNORMAL HIGH (ref 0–99)
NonHDL: 175.6
Total CHOL/HDL Ratio: 5
Triglycerides: 118 mg/dL (ref 0.0–149.0)
VLDL: 23.6 mg/dL (ref 0.0–40.0)

## 2022-02-06 LAB — MICROALBUMIN / CREATININE URINE RATIO
Creatinine,U: 178.7 mg/dL
Microalb Creat Ratio: 1.9 mg/g (ref 0.0–30.0)
Microalb, Ur: 3.4 mg/dL — ABNORMAL HIGH (ref 0.0–1.9)

## 2022-02-06 LAB — TSH: TSH: 4.47 u[IU]/mL (ref 0.35–5.50)

## 2022-02-06 NOTE — Patient Instructions (Addendum)
Please schedule Medicare Wellness visit.  ? ?  ? ?GO TO THE LAB : Get the blood work   ? ? ?Canones, Imogene ?Come back for  a physical exam in 4 months  ?

## 2022-02-06 NOTE — Progress Notes (Signed)
? ?  Subjective:  ? ? Patient ID: Christopher Guerra, male    DOB: 02-16-1956, 66 y.o.   MRN: [id number] ? ?DOS:  02/06/2022 ?Type of visit - description: f/u ? ?Since the last office visit is doing well. ?Went to [place] for 3 months, while there did not check his BP but feels well. ?He ate healthy and remain active. ?Has not gained weight. ?Has  a "hernia" at the upper abdomen, wonders if that needs to be operated.  Denies pain, nausea or vomiting. ? ?Review of Systems ?See above  ? ?Past Medical History:  ?Diagnosis Date  ? Diabetes mellitus without complication (HCC)   ? Elevated PSA   ? Gout   ? ? of gout  ? Tinnitus   ? ? ?Past Surgical History:  ?Procedure Laterality Date  ? CATARACT EXTRACTION Bilateral 2015  ? ? ?Current Outpatient Medications  ?Medication Instructions  ? amLODipine (NORVASC) 5 MG tablet TAKE 1 TABLET(5 MG) BY MOUTH DAILY  ? pravastatin (PRAVACHOL) 20 mg, Oral, Daily at bedtime  ? tamsulosin (FLOMAX) 0.4 mg, Oral  ? ? ?   ?Objective:  ? Physical Exam ?BP 136/82 (BP Location: Left Arm, Patient Position: Sitting, Cuff Size: Small)   Pulse 63   Temp 98 ?F (36.7 ?C) (Oral)   Resp 16   Ht 5\' 2"  (1.575 m)   Wt 180 lb 2 oz (81.7 kg)   SpO2 98%   BMI 32.95 kg/m?  ?General:   ?Well developed, NAD, BMI noted.  ?HEENT:  ?Normocephalic . Face symmetric, atraumatic ?Lungs:  ?CTA B ?Normal respiratory effort, no intercostal retractions, no accessory muscle use. ?Heart: RRR,  no murmur.  ?Abdomen:  ?Not distended, soft, non-tender.  Diastases recti noted at the upper abdomen. ?Skin: Not pale. Not jaundice ?Lower extremities: no pretibial edema bilaterally  ?Neurologic:  ?alert & oriented X3.  ?Speech normal, gait appropriate for age and unassisted ?Psych--  ?Cognition and judgment appear intact.  ?Cooperative with normal attention span and concentration.  ?Behavior appropriate. ?No anxious or depressed appearing. ? ?   ?Assessment   ? ? Assessment ?DM: A1c 6.5   (2013) ?HTN ?H/o Gout  ?H/o  Mild  Tinnitus , reports he saw ENT before  ?Cataract surgery B ?PNM 02/2019, suspected Covid-19 ?PSA elevated  ? ?PLAN: ?DM: Diet controlled, seems to be doing well, check A1c and micro.  Also check TSH ?HTN: No recent ambulatory BPs, on amlodipine, BP is very good, checking CMP, CBC. ?High cholesterol: Patient was prescribed atorvastatin with good results however September 2022 he reported muscle aches, was switched to Pravachol, checking labs ?Diastasis recti: Explained to patient what it is, no need for surgery unless he develops symptoms such as pain, nausea, vomiting or severe enlargement. ?Preventive care: PNM 20 today ?RTC 4 months CPX ?  ?  ?

## 2022-02-07 DIAGNOSIS — M6208 Separation of muscle (nontraumatic), other site: Secondary | ICD-10-CM | POA: Insufficient documentation

## 2022-02-07 NOTE — Assessment & Plan Note (Signed)
DM: Diet controlled, seems to be doing well, check A1c and micro.  Also check TSH ?HTN: No recent ambulatory BPs, on amlodipine, BP is very good, checking CMP, CBC. ?High cholesterol: Patient was prescribed atorvastatin with good results however September 2022 he reported muscle aches, was switched to Pravachol, checking labs ?Diastasis recti: Explained to patient what it is, no need for surgery unless he develops symptoms such as pain, nausea, vomiting or severe enlargement. ?Preventive care: PNM 20 today ?RTC 4 months CPX ?  ?

## 2022-03-04 LAB — PSA: PSA: 4.16

## 2022-03-12 ENCOUNTER — Encounter: Payer: Self-pay | Admitting: Internal Medicine

## 2022-03-20 LAB — HM DIABETES FOOT EXAM: HM Diabetic Foot Exam: NORMAL

## 2022-04-18 ENCOUNTER — Ambulatory Visit (INDEPENDENT_AMBULATORY_CARE_PROVIDER_SITE_OTHER): Payer: Medicare Other | Admitting: Internal Medicine

## 2022-04-18 ENCOUNTER — Other Ambulatory Visit: Payer: Self-pay | Admitting: Internal Medicine

## 2022-04-18 ENCOUNTER — Encounter: Payer: Self-pay | Admitting: Internal Medicine

## 2022-04-18 VITALS — BP 130/80 | HR 71 | Temp 98.1°F | Resp 16 | Ht 62.0 in | Wt 172.4 lb

## 2022-04-18 DIAGNOSIS — K648 Other hemorrhoids: Secondary | ICD-10-CM

## 2022-04-18 DIAGNOSIS — R109 Unspecified abdominal pain: Secondary | ICD-10-CM | POA: Diagnosis not present

## 2022-04-18 DIAGNOSIS — M545 Low back pain, unspecified: Secondary | ICD-10-CM

## 2022-04-18 LAB — POC URINALSYSI DIPSTICK (AUTOMATED)
Bilirubin, UA: NEGATIVE
Blood, UA: NEGATIVE
Glucose, UA: NEGATIVE
Ketones, UA: NEGATIVE
Leukocytes, UA: NEGATIVE
Nitrite, UA: NEGATIVE
Protein, UA: NEGATIVE
Spec Grav, UA: 1.02 (ref 1.010–1.025)
Urobilinogen, UA: 0.2 E.U./dL
pH, UA: 5 (ref 5.0–8.0)

## 2022-04-18 MED ORDER — MELOXICAM 7.5 MG PO TABS: 7.5000 mg | ORAL_TABLET | Freq: Every day | ORAL | 0 refills | Status: DC | PRN

## 2022-04-18 MED ORDER — METHOCARBAMOL 500 MG PO TABS: 500.0000 mg | ORAL_TABLET | Freq: Three times a day (TID) | ORAL | 0 refills | Status: DC | PRN

## 2022-04-18 NOTE — Patient Instructions (Addendum)
Rest, no heavy lifting  Cold or warm compress  Meloxicam 7.5 mg: 1 tablet daily with food until better  Can also take Tylenol  500 mg OTC 2 tabs a day every 8 hours as needed for pain  Robaxin is a muscle relaxant, 3 times a day as needed, will cause drowsiness.  You may like to use it only at night  Call if not gradually better  Rectal bleeding: If you have severe bleeding let me know   Your next appointment is already set for September.

## 2022-04-18 NOTE — Progress Notes (Unsigned)
Subjective:    Patient ID: Christopher Guerra, male    DOB: 09/12/1956, 66 y.o.   MRN: [id number]  DOS:  04/18/2022 Type of visit - description: Acute  Initially set the visit for back pain but he actually has 2 problems.  5 days history of pain on the left side of the back, at the level of L1, pain is a steady, moderate, does not recall any injury. No rash No bladder or bowel incontinence The patient radiates anteriorly to the abdomen and to the lateral side of the left leg.  Also, had red blood per rectum for 3 days, symptoms stopped 2 days ago. The blood was red and fresh, no melena. No pain or rectal itching.   Review of Systems See above   Past Medical History:  Diagnosis Date   Diabetes mellitus without complication (HCC)    Elevated PSA    Gout    ? of gout   Tinnitus     Past Surgical History:  Procedure Laterality Date   CATARACT EXTRACTION Bilateral 2015    Current Outpatient Medications  Medication Instructions   amLODipine (NORVASC) 5 MG tablet TAKE 1 TABLET(5 MG) BY MOUTH DAILY   pravastatin (PRAVACHOL) 20 mg, Oral, Daily at bedtime   tamsulosin (FLOMAX) 0.4 mg, Oral       Objective:   Physical Exam BP 130/80   Pulse 71   Temp 98.1 F (36.7 C) (Oral)   Resp 16   Ht 5\' 2"  (1.575 m)   Wt 172 lb 6 oz (78.2 kg)   SpO2 95%   BMI 31.53 kg/m  General:   Well developed, NAD, BMI noted.  HEENT:  Normocephalic . Face symmetric, atraumatic MSK: No TTP at the thoracolumbar spine  Abdomen: Soft, nontender. DRE: Normal sphincter tone, no stools. Anoscopy: With the patient consent, I introduced self lighted anoscopy. I found + Internal hemorrhoids with no actual bleeding.  Skin: No rash at the abdomen, back, legs. Lower extremities: no pretibial edema bilaterally  Neurologic:  alert & oriented X3.  Speech normal, gait appropriate for age and unassisted. Motor symmetric. DTRs: Normal and symmetric. Straight leg test:   Questionably positive  on the left Psych--  Cognition and judgment appear intact.  Cooperative with normal attention span and concentration.  Behavior appropriate. No anxious or depressed appearing.     Assessment     Assessment DM: A1c 6.5   (2013) HTN H/o Gout  H/o  Mild Tinnitus , reports he saw ENT before  Cataract surgery B PNM 02/2019, suspected Covid-19 PSA elevated   PLAN: Back pain: As described above, given radiation to the leg this could be a radiculopathy however the dermatomes do not fit any particular lumbar level. DDx includes shingles. Udip neg Plan meloxicam, muscle relaxant, Tylenol, warm or cold compresses.  Call if not better.  Watch for rashe  Internal hemorrhoids: Reports red blood per rectum x3 days without associated symptoms.  Anoscopy showed hemorrhoids, I think those are the culprits. Recommend observation, he is due for colonoscopy in few months.  ============ DM: Diet controlled, seems to be doing well, check A1c and micro.  Also check TSH HTN: No recent ambulatory BPs, on amlodipine, BP is very good, checking CMP, CBC. High cholesterol: Patient was prescribed atorvastatin with good results however September 2022 he reported muscle aches, was switched to Pravachol, checking labs Diastasis recti: Explained to patient what it is, no need for surgery unless he develops symptoms such as pain, nausea, vomiting or  severe enlargement. Preventive care: PNM 20 today RTC 4 months CPX

## 2022-04-20 NOTE — Assessment & Plan Note (Signed)
Back pain: As described above, given radiation to the leg this could be a radiculopathy however the dermatomes do not fit any particular lumbar level. DDx includes shingles. Udip neg Plan meloxicam, muscle relaxant, Tylenol, warm or cold compresses.  Call if not better.  Watch for rashes. Internal hemorrhoids: Reports red blood per rectum x3 days without associated symptoms.  Anoscopy showed hemorrhoids, I think those are the culprits. Recommend observation, he is due for colonoscopy in few months.

## 2022-04-21 ENCOUNTER — Encounter: Payer: Self-pay | Admitting: Internal Medicine

## 2022-04-22 ENCOUNTER — Telehealth: Payer: Self-pay | Admitting: Internal Medicine

## 2022-04-22 MED ORDER — HYDROCORTISONE ACETATE 25 MG RE SUPP: 25.0000 mg | Freq: Two times a day (BID) | RECTAL | 1 refills | Status: DC | PRN

## 2022-04-22 NOTE — Telephone Encounter (Signed)
Anusol suppositories to be used as needed sent

## 2022-04-22 NOTE — Telephone Encounter (Signed)
Pt stated he was told by pcp if he needed medication for hemorrhoids he could just call. Please advise.   [id number] [place], [place] - [address] AT [facility] [address] & [facility]   [address] [place], [place] [address]  Phone:  [phone]  Fax:  [phone]

## 2022-04-22 NOTE — Telephone Encounter (Signed)
Spoke w/ Pt- informed Rx sent. Pt verbalized understanding.

## 2022-04-22 NOTE — Telephone Encounter (Signed)
Please advise 

## 2022-06-02 ENCOUNTER — Other Ambulatory Visit: Payer: Self-pay | Admitting: Internal Medicine

## 2022-06-02 MED ORDER — PRAVASTATIN SODIUM 20 MG PO TABS: 20.0000 mg | ORAL_TABLET | Freq: Every day | ORAL | 4 refills | Status: DC

## 2022-06-17 ENCOUNTER — Encounter: Payer: Self-pay | Admitting: Internal Medicine

## 2022-06-17 ENCOUNTER — Ambulatory Visit (INDEPENDENT_AMBULATORY_CARE_PROVIDER_SITE_OTHER): Payer: Medicare Other | Admitting: Internal Medicine

## 2022-06-17 VITALS — BP 138/72 | HR 52 | Temp 97.6°F | Resp 16 | Ht 62.0 in | Wt 172.4 lb

## 2022-06-17 DIAGNOSIS — E119 Type 2 diabetes mellitus without complications: Secondary | ICD-10-CM | POA: Diagnosis not present

## 2022-06-17 DIAGNOSIS — E785 Hyperlipidemia, unspecified: Secondary | ICD-10-CM | POA: Diagnosis not present

## 2022-06-17 DIAGNOSIS — I10 Essential (primary) hypertension: Secondary | ICD-10-CM

## 2022-06-17 LAB — HEMOGLOBIN A1C: Hgb A1c MFr Bld: 6.4 % (ref 4.6–6.5)

## 2022-06-17 LAB — LIPID PANEL
Cholesterol: 168 mg/dL (ref 0–200)
HDL: 52.2 mg/dL (ref >39.00)
LDL Cholesterol: 93 mg/dL (ref 0–99)
NonHDL: 116.03
Total CHOL/HDL Ratio: 3
Triglycerides: 117 mg/dL (ref 0.0–149.0)
VLDL: 23.4 mg/dL (ref 0.0–40.0)

## 2022-06-17 LAB — AST: AST: 22 U/L (ref 0–37)

## 2022-06-17 LAB — ALT: ALT: 22 U/L (ref 0–53)

## 2022-06-17 MED ORDER — AMLODIPINE BESYLATE 5 MG PO TABS: ORAL_TABLET | ORAL | 3 refills | Status: DC

## 2022-06-17 MED ORDER — SHINGRIX 50 MCG/0.5ML IM SUSR: 0.5000 mL | Freq: Once | INTRAMUSCULAR | 1 refills | Status: AC

## 2022-06-17 MED ORDER — TETANUS-DIPHTH-ACELL PERTUSSIS 5-2.5-18.5 LF-MCG/0.5 IM SUSP
0.5000 mL | Freq: Once | INTRAMUSCULAR | 0 refills | Status: AC
Start: 2022-06-17 — End: 2022-06-17

## 2022-06-17 NOTE — Progress Notes (Unsigned)
   Subjective:    Patient ID: Christopher Guerra, male    DOB: May 28, 1956, 66 y.o.   MRN: [id number]  DOS:  06/17/2022 Type of visit - description: Follow-up  We discussed his chronic medical problems. In general feeling well. Has no concerns or symptoms  Review of Systems See above   Past Medical History:  Diagnosis Date   Diabetes mellitus without complication (HCC)    Elevated PSA    Gout    ? of gout   Tinnitus     Past Surgical History:  Procedure Laterality Date   CATARACT EXTRACTION Bilateral 2015    Current Outpatient Medications  Medication Instructions   amLODipine (NORVASC) 5 MG tablet TAKE 1 TABLET(5 MG) BY MOUTH DAILY   hydrocortisone (ANUSOL-HC) 25 mg, Rectal, 2 times daily PRN   meloxicam (MOBIC) 7.5 mg, Oral, Daily PRN   methocarbamol (ROBAXIN) 500 mg, Oral, Every 8 hours PRN   pravastatin (PRAVACHOL) 20 mg, Oral, Daily at bedtime   tamsulosin (FLOMAX) 0.4 mg, Oral   Tdap (BOOSTRIX) 5-2.5-18.5 LF-MCG/0.5 injection 0.5 mLs, Intramuscular,  Once   Zoster Vaccine Adjuvanted (SHINGRIX) injection 0.5 mLs, Intramuscular,  Once       Objective:   Physical Exam BP 138/72   Pulse (!) 52   Temp 97.6 F (36.4 C) (Oral)   Resp 16   Ht 5\' 2"  (1.575 m)   Wt 172 lb 6 oz (78.2 kg)   SpO2 98%   BMI 31.53 kg/m  General:   Well developed, NAD, BMI noted.  HEENT:  Normocephalic . Face symmetric, atraumatic Lungs:  CTA B Normal respiratory effort, no intercostal retractions, no accessory muscle use. Heart: RRR,  no murmur.  Abdomen:  Not distended, soft, non-tender. No rebound or rigidity.   Skin: Not pale. Not jaundice Lower extremities: no pretibial edema bilaterally  Neurologic:  alert & oriented X3.  Speech normal, gait appropriate for age and unassisted Psych--  Cognition and judgment appear intact.  Cooperative with normal attention span and concentration.  Behavior appropriate. No anxious or depressed appearing.     Assessment      Assessment DM: A1c 6.5   (2013) HTN Hyperlipidemia H/o Gout  H/o  Mild Tinnitus , reports he saw ENT before  Cataract surgery B PNM 02/2019, suspected Covid-19 PSA elevated   PLAN: Routine office visit DM: Diet controlled, recheck A1c. HTN: BP today satisfactory, few months ago self stopped amlodipine because ambulatory BPs were 138, 140.  Advised about the benefits of having BP even better than 140.  We agreed to restart amlodipine, Rx sent. High cholesterol: Last LDL was 152 in the context of poor Pravachol compliance, at this point he reports he is taking Pravachol regularly, check FLP AST ALT. Preventive care: Vaccine advice provided.  See AVS. RTC 4 months

## 2022-06-17 NOTE — Patient Instructions (Addendum)
Recommend to proceed with the following vaccines at your pharmacy:  -Shingrix (shingles) -Tdap (tetanus) -Covid booster (bivalent) -Flu shot this fall  Restart amlodipine 5 mg 1 tablet daily.  That will help with your blood pressure. Check the  blood pressure regularly BP GOAL is between 110/65 and  135/85. If it is consistently higher or lower, let me know     GO TO THE LAB : Get the blood work     GO TO THE FRONT DESK, PLEASE SCHEDULE YOUR APPOINTMENTS Come back for   a checkup in 4 months    Per our records you are due for your diabetic eye exam. Please contact your eye doctor to schedule an appointment. Please have them send copies of your office visit notes to [place]. Our fax number is [phone]. If you need a referral to an eye doctor please let us know.

## 2022-06-18 ENCOUNTER — Telehealth: Payer: Self-pay | Admitting: Internal Medicine

## 2022-06-18 NOTE — Telephone Encounter (Signed)
-----   Message from [place], [place] sent at 06/17/2022  2:39 PM EDT ----- Regarding: F/U appt and AWV Will you contact Pt- he didn't stop by check out today after visit, he needs a f/u w/ Dr. Drue Novel in 4 months, and an AWV anytime please.    Thank you,  [facility]

## 2022-06-18 NOTE — Assessment & Plan Note (Signed)
Routine office visit DM: Diet controlled, recheck A1c. HTN: BP today satisfactory, few months ago self stopped amlodipine because ambulatory BPs were 138, 140.  Advised about the benefits of having BP even better than 140.  We agreed to restart amlodipine, Rx sent. High cholesterol: Last LDL was 152 in the context of poor Pravachol compliance, at this point he reports he is taking Pravachol regularly, check FLP AST ALT. Preventive care: Vaccine advice provided.  See AVS. RTC 4 months

## 2022-06-18 NOTE — Telephone Encounter (Signed)
Called pt no answer - pt does not have MB set up. Will call again.

## 2022-08-27 ENCOUNTER — Other Ambulatory Visit: Payer: Self-pay | Admitting: Internal Medicine

## 2022-09-17 DIAGNOSIS — H9313 Tinnitus, bilateral: Secondary | ICD-10-CM | POA: Diagnosis not present

## 2022-09-17 DIAGNOSIS — H903 Sensorineural hearing loss, bilateral: Secondary | ICD-10-CM | POA: Diagnosis not present

## 2022-09-26 DIAGNOSIS — H26493 Other secondary cataract, bilateral: Secondary | ICD-10-CM | POA: Diagnosis not present

## 2022-09-26 DIAGNOSIS — H52201 Unspecified astigmatism, right eye: Secondary | ICD-10-CM | POA: Diagnosis not present

## 2022-09-26 DIAGNOSIS — H04123 Dry eye syndrome of bilateral lacrimal glands: Secondary | ICD-10-CM | POA: Diagnosis not present

## 2022-09-26 DIAGNOSIS — H5213 Myopia, bilateral: Secondary | ICD-10-CM | POA: Diagnosis not present

## 2022-09-26 DIAGNOSIS — H524 Presbyopia: Secondary | ICD-10-CM | POA: Diagnosis not present

## 2022-09-26 LAB — HM DIABETES EYE EXAM

## 2022-10-10 ENCOUNTER — Telehealth: Payer: Self-pay | Admitting: Internal Medicine

## 2022-10-10 NOTE — Telephone Encounter (Signed)
Informed the pt about his refill and also to schedule fu appt in January - pt stated will be out of [place] and will not be able to come in January. Pt stated as soon as he is back from his country will schedule an appt with PCP. Done

## 2022-10-10 NOTE — Telephone Encounter (Signed)
Christopher Guerra- can you try contacting Pt- I have refilled him pravastatin but needs f/u w/ PCP next month (January). Thank you.

## 2022-10-14 ENCOUNTER — Other Ambulatory Visit: Payer: Self-pay | Admitting: Internal Medicine

## 2022-12-11 ENCOUNTER — Encounter: Payer: Self-pay | Admitting: Internal Medicine

## 2023-01-26 ENCOUNTER — Emergency Department (HOSPITAL_BASED_OUTPATIENT_CLINIC_OR_DEPARTMENT_OTHER): Payer: Medicare Other

## 2023-01-26 ENCOUNTER — Emergency Department (HOSPITAL_BASED_OUTPATIENT_CLINIC_OR_DEPARTMENT_OTHER)
Admission: EM | Admit: 2023-01-26 | Discharge: 2023-01-26 | Disposition: A | Discharge status: Home / Self Care | Payer: Medicare Other | Attending: Emergency Medicine | Admitting: Emergency Medicine

## 2023-01-26 ENCOUNTER — Encounter (HOSPITAL_BASED_OUTPATIENT_CLINIC_OR_DEPARTMENT_OTHER): Payer: Self-pay

## 2023-01-26 ENCOUNTER — Other Ambulatory Visit: Payer: Self-pay

## 2023-01-26 DIAGNOSIS — I10 Essential (primary) hypertension: Secondary | ICD-10-CM | POA: Insufficient documentation

## 2023-01-26 DIAGNOSIS — Z79899 Other long term (current) drug therapy: Secondary | ICD-10-CM | POA: Insufficient documentation

## 2023-01-26 DIAGNOSIS — M549 Dorsalgia, unspecified: Secondary | ICD-10-CM | POA: Diagnosis not present

## 2023-01-26 DIAGNOSIS — R509 Fever, unspecified: Secondary | ICD-10-CM | POA: Insufficient documentation

## 2023-01-26 DIAGNOSIS — E119 Type 2 diabetes mellitus without complications: Secondary | ICD-10-CM | POA: Insufficient documentation

## 2023-01-26 DIAGNOSIS — Z20822 Contact with and (suspected) exposure to covid-19: Secondary | ICD-10-CM | POA: Diagnosis not present

## 2023-01-26 DIAGNOSIS — R109 Unspecified abdominal pain: Secondary | ICD-10-CM | POA: Diagnosis not present

## 2023-01-26 DIAGNOSIS — I700 Atherosclerosis of aorta: Secondary | ICD-10-CM | POA: Diagnosis not present

## 2023-01-26 DIAGNOSIS — R748 Abnormal levels of other serum enzymes: Secondary | ICD-10-CM | POA: Diagnosis not present

## 2023-01-26 DIAGNOSIS — M791 Myalgia, unspecified site: Secondary | ICD-10-CM | POA: Diagnosis not present

## 2023-01-26 DIAGNOSIS — R52 Pain, unspecified: Secondary | ICD-10-CM

## 2023-01-26 DIAGNOSIS — M7918 Myalgia, other site: Secondary | ICD-10-CM | POA: Diagnosis not present

## 2023-01-26 DIAGNOSIS — R1084 Generalized abdominal pain: Secondary | ICD-10-CM | POA: Diagnosis not present

## 2023-01-26 LAB — LIPASE, BLOOD: Lipase: 80 U/L — ABNORMAL HIGH (ref 11–51)

## 2023-01-26 LAB — CBC WITH DIFFERENTIAL/PLATELET
Abs Immature Granulocytes: 0.01 10*3/uL (ref 0.00–0.07)
Basophils Absolute: 0 10*3/uL (ref 0.0–0.1)
Basophils Relative: 0 %
Eosinophils Absolute: 0 10*3/uL (ref 0.0–0.5)
Eosinophils Relative: 0 %
HCT: 37.3 % — ABNORMAL LOW (ref 39.0–52.0)
Hemoglobin: 12.6 g/dL — ABNORMAL LOW (ref 13.0–17.0)
Immature Granulocytes: 0 %
Lymphocytes Relative: 29 %
Lymphs Abs: 2 10*3/uL (ref 0.7–4.0)
MCH: 28.6 pg (ref 26.0–34.0)
MCHC: 33.8 g/dL (ref 30.0–36.0)
MCV: 84.8 fL (ref 80.0–100.0)
Monocytes Absolute: 0.6 10*3/uL (ref 0.1–1.0)
Monocytes Relative: 9 %
Neutro Abs: 4.2 10*3/uL (ref 1.7–7.7)
Neutrophils Relative %: 62 %
Platelets: 201 10*3/uL (ref 150–400)
RBC: 4.4 MIL/uL (ref 4.22–5.81)
RDW: 15.4 % (ref 11.5–15.5)
WBC: 6.9 10*3/uL (ref 4.0–10.5)
nRBC: 0 % (ref 0.0–0.2)

## 2023-01-26 LAB — URINALYSIS, W/ REFLEX TO CULTURE (INFECTION SUSPECTED)
Bacteria, UA: NONE SEEN
Bilirubin Urine: NEGATIVE
Glucose, UA: NEGATIVE mg/dL
Ketones, ur: NEGATIVE mg/dL
Leukocytes,Ua: NEGATIVE
Nitrite: NEGATIVE
Protein, ur: 30 mg/dL — AB
Specific Gravity, Urine: 1.023 (ref 1.005–1.030)
pH: 5.5 (ref 5.0–8.0)

## 2023-01-26 LAB — COMPREHENSIVE METABOLIC PANEL
ALT: 27 U/L (ref 0–44)
AST: 34 U/L (ref 15–41)
Albumin: 3.7 g/dL (ref 3.5–5.0)
Alkaline Phosphatase: 56 U/L (ref 38–126)
Anion gap: 9 (ref 5–15)
BUN: 10 mg/dL (ref 8–23)
CO2: 23 mmol/L (ref 22–32)
Calcium: 8.4 mg/dL — ABNORMAL LOW (ref 8.9–10.3)
Chloride: 104 mmol/L (ref 98–111)
Creatinine, Ser: 0.96 mg/dL (ref 0.61–1.24)
GFR, Estimated: >60 mL/min (ref >60)
Glucose, Bld: 117 mg/dL — ABNORMAL HIGH (ref 70–99)
Potassium: 3.6 mmol/L (ref 3.5–5.1)
Sodium: 136 mmol/L (ref 135–145)
Total Bilirubin: 0.3 mg/dL (ref 0.3–1.2)
Total Protein: 6.9 g/dL (ref 6.5–8.1)

## 2023-01-26 LAB — CK: Total CK: 45 U/L — ABNORMAL LOW (ref 49–397)

## 2023-01-26 LAB — RESP PANEL BY RT-PCR (RSV, FLU A&B, COVID)  RVPGX2
Influenza A by PCR: NEGATIVE
Influenza B by PCR: NEGATIVE
Resp Syncytial Virus by PCR: NEGATIVE
SARS Coronavirus 2 by RT PCR: NEGATIVE

## 2023-01-26 LAB — CULTURE, BLOOD (ROUTINE X 2)

## 2023-01-26 LAB — LACTIC ACID, PLASMA: Lactic Acid, Venous: 0.7 mmol/L (ref 0.5–1.9)

## 2023-01-26 MED ORDER — IOHEXOL 300 MG/ML  SOLN
100.0000 mL | Freq: Once | INTRAMUSCULAR | Status: AC | PRN
  Administered 2023-01-26: 75 mL via INTRAVENOUS

## 2023-01-26 MED ORDER — LACTATED RINGERS IV BOLUS
1000.0000 mL | Freq: Once | INTRAVENOUS | Status: AC
  Administered 2023-01-26: 1000 mL via INTRAVENOUS

## 2023-01-26 NOTE — ED Notes (Signed)
Patient transported to CT 

## 2023-01-26 NOTE — ED Triage Notes (Signed)
Patient states he has had fever and body aches x 3 days. Denies n/v/d

## 2023-01-26 NOTE — ED Provider Notes (Signed)
[facility] EMERGENCY DEPARTMENT AT [facility] Provider Note   CSN: [id number] Arrival date & time: 01/26/23  4098     History  Chief Complaint  Patient presents with   Generalized Body Aches    Christopher Guerra is a 67 y.o. male.  HPI      67yo male with history of diet controlled DM, htn, hlpd, gout, who presents with concern for fever, body aches, abdominal and back pain for 3 days.  3 days fever, body aches, back pain  100.9 fever  Some diffuse abdominal pain but worse on the left side of abdomen No nausea or vomiting, no diarrhea, constipation No pain with urination or difficulty urinating No cough, sore throat or runny nose No rash No tick bites, not spending time in high risk areas No hx of joint/blood infxn/IVDU Appetite is good No chest pain, dyspnea Does have light pressure to head No new medications Used tylenol for fever NO loss control bowel bladder/numbness weakness of lower extremities  Took tylenol at 5AM today  Past Medical History:  Diagnosis Date   Diabetes mellitus without complication    Elevated PSA    Gout    ? of gout   Tinnitus     Home Medications Prior to Admission medications   Medication Sig Start Date End Date Taking? Authorizing Provider  amLODipine (NORVASC) 5 MG tablet TAKE 1 TABLET(5 MG) BY MOUTH DAILY 06/17/22   Wanda Plump, MD  hydrocortisone (ANUSOL-HC) 25 MG suppository Place 1 suppository (25 mg total) rectally 2 (two) times daily as needed for hemorrhoids. Patient not taking: Reported on 06/17/2022 04/22/22   Wanda Plump, MD  meloxicam (MOBIC) 7.5 MG tablet Take 1 tablet (7.5 mg total) by mouth daily as needed for pain. Patient not taking: Reported on 06/17/2022 04/18/22   Wanda Plump, MD  pravastatin (PRAVACHOL) 20 MG tablet Take 1 tablet (20 mg total) by mouth at bedtime. 10/10/22   Wanda Plump, MD  tamsulosin (FLOMAX) 0.4 MG CAPS capsule Take 0.4 mg by mouth.    [provider]      Allergies     Patient has no known allergies.    Review of Systems   Review of Systems  Physical Exam Updated Vital Signs BP (!) 177/92   Pulse (!) 55   Temp 99.2 F (37.3 C) (Oral)   Resp 15   Ht  (1.575 m)   Wt 77.1 kg   SpO2 97%   BMI 31.09 kg/m  Physical Exam Constitutional:      General: He is not in acute distress.    Appearance: Normal appearance. He is not ill-appearing.  HENT:     Head: Normocephalic and atraumatic.  Eyes:     General: No visual field deficit.    Extraocular Movements: Extraocular movements intact.     Conjunctiva/sclera: Conjunctivae normal.     Pupils: Pupils are equal, round, and reactive to light.  Cardiovascular:     Rate and Rhythm: Normal rate and regular rhythm.     Pulses: Normal pulses.  Pulmonary:     Effort: Pulmonary effort is normal. No respiratory distress.  Abdominal:     Tenderness: There is abdominal tenderness (left sided).  Musculoskeletal:        General: No swelling or tenderness.     Cervical back: Normal range of motion.  Skin:    General: Skin is warm and dry.     Findings: No erythema or rash.  Neurological:  General: No focal deficit present.     Mental Status: He is alert and oriented to person, place, and time.     GCS: GCS eye subscore is 4. GCS verbal subscore is 5. GCS motor subscore is 6.     Cranial Nerves: No cranial nerve deficit, dysarthria or facial asymmetry.     Sensory: No sensory deficit.     Motor: No weakness or tremor.     Coordination: Coordination normal. Finger-Nose-Finger Test normal.     Gait: Gait normal.     ED Results / Procedures / Treatments   Labs (all labs ordered are listed, but only abnormal results are displayed) Labs Reviewed  CBC WITH DIFFERENTIAL/PLATELET - Abnormal; Notable for the following components:      Result Value   Hemoglobin 12.6 (*)    HCT 37.3 (*)    All other components within normal limits  COMPREHENSIVE METABOLIC PANEL - Abnormal; Notable for the following  components:   Glucose, Bld 117 (*)    Calcium 8.4 (*)    All other components within normal limits  LIPASE, BLOOD - Abnormal; Notable for the following components:   Lipase 80 (*)    All other components within normal limits  CK - Abnormal; Notable for the following components:   Total CK 45 (*)    All other components within normal limits  URINALYSIS, W/ REFLEX TO CULTURE (INFECTION SUSPECTED) - Abnormal; Notable for the following components:   Hgb urine dipstick SMALL (*)    Protein, ur 30 (*)    All other components within normal limits  CULTURE, BLOOD (ROUTINE X 2)  CULTURE, BLOOD (ROUTINE X 2)  RESP PANEL BY RT-PCR (RSV, FLU A&B, COVID)  RVPGX2  LACTIC ACID, PLASMA    EKG EKG Interpretation  Date/Time:  Monday January 26 2023 07:37:56 EDT Ventricular Rate:  61 PR Interval:  149 QRS Duration: 138 QT Interval:  391 QTC Calculation: 394 R Axis:   -31 Text Interpretation: Sinus rhythm IVCD, consider atypical RBBB New appearance of RBBB, IVCD compared to prior in 2016 Confirmed by Alvira Monday ([id number]) on 01/26/2023 7:59:06 AM  Radiology CT ABDOMEN PELVIS W CONTRAST  Result Date: 01/26/2023 CLINICAL DATA:  Pain EXAM: CT ABDOMEN AND PELVIS WITH CONTRAST TECHNIQUE: Multidetector CT imaging of the abdomen and pelvis was performed using the standard protocol following bolus administration of intravenous contrast. RADIATION DOSE REDUCTION: This exam was performed according to the departmental dose-optimization program which includes automated exposure control, adjustment of the mA and/or kV according to patient size and/or use of iterative reconstruction technique. CONTRAST:  75mL OMNIPAQUE IOHEXOL 300 MG/ML  SOLN COMPARISON:  01/14/2015. FINDINGS: Lower chest: Cardiomegaly. No pericardial effusion. Tiny bilateral pleural effusions and dependent subsegmental atelectasis. Hepatobiliary: No focal liver abnormality is seen. No gallstones, gallbladder wall thickening, or biliary dilatation.  Pancreas: Unremarkable. No pancreatic ductal dilatation or surrounding inflammatory changes. Spleen: Normal in size without focal abnormality. Adrenals/Urinary Tract: Bilateral renal cysts identified measuring 1.5 cm on the left and 1.23 cm on the right. No imaging follow up recommended. No hydronephrosis or nephrolithiasis. Unremarkable urinary bladder. Stomach/Bowel: No bowel dilatation to suggest obstruction. Mild upper abdominal mesenteric edematous changes is stable finding consistent with a nonspecific inflammation. Diverticulosis of the colon with no diverticulitis. Unremarkable appendix. Vascular/Lymphatic: Aortic atherosclerosis. No enlarged abdominal or pelvic lymph nodes. Reproductive: Enlarged prostate. Other: Small bilateral inguinal hernias containing fat without incarceration or herniated bowel. Musculoskeletal: Degenerative changes. No acute osseous abnormalities identified. IMPRESSION: 1. Cardiomegaly. 2. Minimal bilateral  pleural effusion and dependent subsegmental atelectasis. 3. Bilateral renal cysts. 4. Nonspecific upper abdominal mesenteric edema. 5. Diverticulosis. 6. Enlarged prostate Electronically Signed   By: Layla Maw M.D.   On: 01/26/2023 08:40    Procedures Procedures    Medications Ordered in ED Medications  lactated ringers bolus 1,000 mL (0 mLs Intravenous Stopped 01/26/23 0910)  iohexol (OMNIPAQUE) 300 MG/ML solution 100 mL (75 mLs Intravenous Contrast Given 01/26/23 0814)    ED Course/ Medical Decision Making/ A&P                              67yo male with history of diet controlled DM, htn, hlpd, gout, who presents with concern for fever, body aches, abdominal and back pain for 3 days.  DDx includes viral illness including COVID, influenza, pyelonephritis, diverticulitis, spinal abscess, bacteremia, electrolyte abnormalities.  No cough or respiratory symptoms to suggest pneumonia, no other URI symptoms. No dyspnea or cp to suggest acs/PE/chf. Normal mental  status after 3 days, doubt meningitis/encephalitis.   EKG completed to evaluate in setting of fatigue, aches shows new intraventricular conduction delay compared to 2016.   Labs obtained including blood cultures with hx of body aches fever for 3 days.  Labs obtained and personally evaluated and interpreted by me show no leukocytosis, mild anemia, no sign of UTI.  Electrolytes WNL. Lactate WNL. Lipase mildly elevated but not consistent with pancreatitis. CK not elevated.   CT abdomen pelvis obtained and shows cardiomegaly, bilateral pleural effusion minimal,nonspecific upper abdominal mesenteric edema, enlarged prostate.   COVID/flu/RSV testing negative.  Do not see sign of significant bacterial infection as etiology of fever or body aches at this time, blood cx pending. Do not see indication for admission given no significant vital sign or lab abnormalities.  Unclear etiology of mesenteric edema, can consider furhter evaluation of cardiomegaly/effusion and this as outpt but do not feel this is etiology of fever/aches. Suspect likely viral syndrome-recommend continued supportive care. Patient discharged in stable condition with understanding of reasons to return.         Final Clinical Impression(s) / ED Diagnoses Final diagnoses:  Generalized body aches  Abdominal discomfort  Fever, unspecified fever cause    Rx / DC Orders ED Discharge Orders     None         Alvira Monday, MD 01/26/23 [phone]

## 2023-01-26 NOTE — ED Notes (Signed)
Discharge instructions and follow up care reviewed and explained. Pt verbalized understanding and had no further questions. 

## 2023-01-27 ENCOUNTER — Encounter: Payer: Self-pay | Admitting: Internal Medicine

## 2023-01-27 LAB — CULTURE, BLOOD (ROUTINE X 2)
Culture: NO GROWTH
Special Requests: ADEQUATE

## 2023-01-29 LAB — CULTURE, BLOOD (ROUTINE X 2): Special Requests: ADEQUATE

## 2023-01-31 LAB — CULTURE, BLOOD (ROUTINE X 2): Culture: NO GROWTH

## 2023-02-02 ENCOUNTER — Encounter: Payer: Self-pay | Admitting: Internal Medicine

## 2023-02-02 ENCOUNTER — Telehealth: Payer: Self-pay

## 2023-02-02 ENCOUNTER — Ambulatory Visit (INDEPENDENT_AMBULATORY_CARE_PROVIDER_SITE_OTHER): Payer: Medicare Other | Admitting: Internal Medicine

## 2023-02-02 VITALS — BP 154/88 | HR 60 | Temp 97.8°F | Resp 18 | Ht 62.0 in | Wt 174.5 lb

## 2023-02-02 DIAGNOSIS — R7989 Other specified abnormal findings of blood chemistry: Secondary | ICD-10-CM

## 2023-02-02 DIAGNOSIS — E119 Type 2 diabetes mellitus without complications: Secondary | ICD-10-CM

## 2023-02-02 DIAGNOSIS — B349 Viral infection, unspecified: Secondary | ICD-10-CM

## 2023-02-02 DIAGNOSIS — R9431 Abnormal electrocardiogram [ECG] [EKG]: Secondary | ICD-10-CM

## 2023-02-02 NOTE — Telephone Encounter (Signed)
        Patient  visited [facility] on 01/26/2023  for Generalized Body Aches.   Telephone encounter attempt :  1st  A HIPAA compliant voice message was left requesting a return call.  Instructed patient to call back at [phone]. Called patient with assistance from [employer] [id number]   Junie Avilla [facility]  [facility] Population Health Community Resource Care Guide   [email or web address]  ?? [phone]   Website: [email or web address]  [email or web address]

## 2023-02-02 NOTE — Patient Instructions (Addendum)
Check the  blood pressure regularly BP GOAL is between 110/65 and  135/85. If it is consistently higher or lower, let me know    GO TO THE LAB : Get the blood work     GO TO THE FRONT DESK, PLEASE SCHEDULE YOUR APPOINTMENTS Come back for   physical exam in 2 months.   parentemente ya es tiempo de hacerse un examen de los ojos. Por favor llame a su doctor de los ojos (ophthalmologist, optometrist) y saque una cita. Solicite que nos envien una copia de la visita a nuestro fax: [phone]. Si necesitara un "referral" nosotros lo podemos hacer

## 2023-02-02 NOTE — Progress Notes (Unsigned)
Subjective:    Patient ID: Christopher Guerra, male    DOB: Feb 20, 1956, 67 y.o.   MRN: [id number]  DOS:  02/02/2023 Type of visit - description: Acute (scheduled for CPX but  we focused  on the recent ER visit.)  The patient visit [place] and came back ~ 01/21/2023. Symptoms started 01/23/2023: Documented fever, generalized myalgias, generalized abdominal discomfort. No chest pain, some shortness of breath.  No lower extremity edema Had some diarrhea but no nausea vomiting.  No blood in the stools. Had significant nasal congestion and frontal headache but no cough. Also had back pain. Does not recall any tick bites.  Went to the ER 01/26/2023 with above symptoms: Workup included a EKG, respiratory virus panel, blood and urine test and a CT of the abdomen. UA was essentially negative, blood cultures negative, calcium 8.4, slightly low.  Total CK 45 (low), white count normal, hemoglobin 12.6 slightly low.  Lipase 80s slightly high.  CT abdomen and pelvis: Cardiomegaly, minimal bilateral pleural effusions, renal cysts, nonspecific upper abdominal mesenteric edema, diverticulosis and enlarged prostate.  Review of Systems Since the ER visit, he has improved. No further fever.  He still feels tired. He still has nasal congestion frontal headache. Appetite is poor but it has improved in the last couple of days with normal p.o. tolerance. No further diarrhea. Interestingly has noted some generalized pruritus without a rash.  The only thing he is taking is over-the-counter electrolyte solutions.  Past Medical History:  Diagnosis Date   Diabetes mellitus without complication    Elevated PSA    Gout    ? of gout   Tinnitus     Past Surgical History:  Procedure Laterality Date   CATARACT EXTRACTION Bilateral 2015    Current Outpatient Medications  Medication Instructions   amLODipine (NORVASC) 5 MG tablet TAKE 1 TABLET(5 MG) BY MOUTH DAILY   hydrocortisone (ANUSOL-HC) 25 mg,  Rectal, 2 times daily PRN   meloxicam (MOBIC) 7.5 mg, Oral, Daily PRN   pravastatin (PRAVACHOL) 20 mg, Oral, Daily at bedtime   tamsulosin (FLOMAX) 0.4 mg, Oral       Objective:   Physical Exam BP (!) 156/84   Pulse 60   Temp 97.8 F (36.6 C) (Oral)   Resp 18   Ht  (1.575 m)   Wt 174 lb 8 oz (79.2 kg)   SpO2 98%   BMI 31.92 kg/m  General:   Well developed, NAD, BMI noted.  HEENT:  Normocephalic . Face symmetric, atraumatic Lungs:  CTA B Normal respiratory effort, no intercostal retractions, no accessory muscle use. Heart: RRR,  no murmur.  Abdomen:  Not distended, soft, minimal if any midabdomen discomfort or upon palpation without mass or rebound. Skin: Not pale. Not jaundice Lower extremities: no pretibial edema bilaterally  Neurologic:  alert & oriented X3.  Speech normal, gait appropriate for age and unassisted Psych--  Cognition and judgment appear intact.  Cooperative with normal attention span and concentration.  Behavior appropriate. No anxious or depressed appearing.     Assessment     Assessment DM: A1c 6.5   (2013) HTN Hyperlipidemia H/o Gout  H/o  Mild Tinnitus , reports he saw ENT before  Cataract surgery B PNM 02/2019, suspected Covid-19 PSA elevated   PLAN: Viral syndrome: Relatively acute onset of fever, myalgias, abdominal pain, nasal congestion after trip to [place]. No other family members affected. Workup at the ER showed no urinary infection. CT abdomen and pelvis showed several findings including  cardiomegaly, minimal bilateral pleural effusion. Also a non-specific upper abdominal mesenteric edema. Abdominal pain is decreasing.  No longer has fever Although he has cardiomegaly on CT there is no edema; denies SOB or CP. EKG at the ER show some T wave inversions in V3 V4.  EKG today showed T wave inversions from V3.  Symptoms were not consistent with ACS.  Observation. Plan: CMP, CBC HTN: Takes amlodipine "now and then".   Ambulatory BPs typically okay per patient.  BP today is slightly up, advised benefits of taking BP medication consistently includes prevention of heart attack or strokes. Diabetes: Check a A1c. High cholesterol: Not taking statins, reluctant to do so. Pruritus: w/o rash, checking labs -LFTs- otherwise  observation. RTC for CPX in 2 months. RTC if not back to normal in 10 days.

## 2023-02-03 LAB — MICROALBUMIN / CREATININE URINE RATIO
Creatinine,U: 104.5 mg/dL
Microalb Creat Ratio: 4.4 mg/g (ref 0.0–30.0)
Microalb, Ur: 4.6 mg/dL — ABNORMAL HIGH (ref 0.0–1.9)

## 2023-02-03 LAB — COMPREHENSIVE METABOLIC PANEL
ALT: 75 U/L — ABNORMAL HIGH (ref 0–53)
AST: 101 U/L — ABNORMAL HIGH (ref 0–37)
Albumin: 3.6 g/dL (ref 3.5–5.2)
Alkaline Phosphatase: 78 U/L (ref 39–117)
BUN: 14 mg/dL (ref 6–23)
CO2: 28 mEq/L (ref 19–32)
Calcium: 8.9 mg/dL (ref 8.4–10.5)
Chloride: 101 mEq/L (ref 96–112)
Creatinine, Ser: 0.98 mg/dL (ref 0.40–1.50)
GFR: 79.93 mL/min (ref >60.00)
Glucose, Bld: 97 mg/dL (ref 70–99)
Potassium: 4.4 mEq/L (ref 3.5–5.1)
Sodium: 137 mEq/L (ref 135–145)
Total Bilirubin: 0.4 mg/dL (ref 0.2–1.2)
Total Protein: 7.4 g/dL (ref 6.0–8.3)

## 2023-02-03 LAB — CBC WITH DIFFERENTIAL/PLATELET
Basophils Absolute: 0.1 10*3/uL (ref 0.0–0.1)
Basophils Relative: 1.6 % (ref 0.0–3.0)
Eosinophils Absolute: 0 10*3/uL (ref 0.0–0.7)
Eosinophils Relative: 0.3 % (ref 0.0–5.0)
HCT: 38 % — ABNORMAL LOW (ref 39.0–52.0)
Hemoglobin: 12.8 g/dL — ABNORMAL LOW (ref 13.0–17.0)
Lymphocytes Relative: 43.1 % (ref 12.0–46.0)
Lymphs Abs: 2.7 10*3/uL (ref 0.7–4.0)
MCHC: 33.8 g/dL (ref 30.0–36.0)
MCV: 85.4 fl (ref 78.0–100.0)
Monocytes Absolute: 1.2 10*3/uL — ABNORMAL HIGH (ref 0.1–1.0)
Monocytes Relative: 18.5 % — ABNORMAL HIGH (ref 3.0–12.0)
Neutro Abs: 2.3 10*3/uL (ref 1.4–7.7)
Neutrophils Relative %: 36.5 % — ABNORMAL LOW (ref 43.0–77.0)
Platelets: 203 10*3/uL (ref 150.0–400.0)
RBC: 4.45 Mil/uL (ref 4.22–5.81)
RDW: 15.9 % — ABNORMAL HIGH (ref 11.5–15.5)
WBC: 6.2 10*3/uL (ref 4.0–10.5)

## 2023-02-03 LAB — HEMOGLOBIN A1C: Hgb A1c MFr Bld: 6.9 % — ABNORMAL HIGH (ref 4.6–6.5)

## 2023-02-03 NOTE — Assessment & Plan Note (Signed)
Viral syndrome: Relatively acute onset of fever, myalgias, abdominal pain, nasal congestion after trip to [place]. No other family members affected. Workup at the ER showed no urinary infection. CT abdomen and pelvis showed several findings including cardiomegaly, minimal bilateral pleural effusion. No specific upper abdominal mesenteric edema. Abdominal pain is decreasing.  No longer has fever Although he has cardiomegaly on CT there is no edema , SOB or CP. EKG at the ER show some T wave inversions in V3 V4.  EKG today showed T wave inversions from V3.  Symptoms were not consistent with ACS.  Observation. Plan: CMP, CBC HTN: Takes amlodipine "now and then".  Ambulatory BPs typically okay per patient.  BP today is slightly up, advised benefits of taking BP medication consistently includes prevention of heart attack or strokes. Diabetes: Check a A1c. High cholesterol: Not taking statins, reluctant to do so. Pruritus: w/o rash, checking labs -LFTs- otherwise  observation. RTC for CPX in 2 months. RTC if not back to normal in 10 days.

## 2023-02-05 NOTE — Addendum Note (Signed)
Addended by: Wilford Corner on: 02/05/2023 01:10 PM   Modules accepted: Orders

## 2023-02-18 ENCOUNTER — Other Ambulatory Visit: Payer: Medicare Other

## 2023-02-18 ENCOUNTER — Other Ambulatory Visit (INDEPENDENT_AMBULATORY_CARE_PROVIDER_SITE_OTHER): Payer: Medicare Other

## 2023-02-18 ENCOUNTER — Encounter: Payer: Self-pay | Admitting: Internal Medicine

## 2023-02-18 DIAGNOSIS — R7989 Other specified abnormal findings of blood chemistry: Secondary | ICD-10-CM

## 2023-02-18 LAB — HEPATIC FUNCTION PANEL
ALT: 42 U/L (ref 0–53)
AST: 36 U/L (ref 0–37)
Albumin: 4 g/dL (ref 3.5–5.2)
Alkaline Phosphatase: 86 U/L (ref 39–117)
Bilirubin, Direct: 0.1 mg/dL (ref 0.0–0.3)
Total Bilirubin: 0.4 mg/dL (ref 0.2–1.2)
Total Protein: 8.1 g/dL (ref 6.0–8.3)

## 2023-03-11 LAB — PSA: PSA: 3.01

## 2023-03-24 ENCOUNTER — Encounter: Payer: Self-pay | Admitting: Internal Medicine

## 2023-04-15 ENCOUNTER — Encounter: Payer: Self-pay | Admitting: Internal Medicine

## 2023-04-17 ENCOUNTER — Encounter: Payer: Medicare Other | Admitting: Internal Medicine

## 2023-05-03 ENCOUNTER — Other Ambulatory Visit: Payer: Self-pay | Admitting: Internal Medicine

## 2023-05-19 ENCOUNTER — Encounter: Payer: Self-pay | Admitting: Internal Medicine

## 2023-05-19 ENCOUNTER — Ambulatory Visit (INDEPENDENT_AMBULATORY_CARE_PROVIDER_SITE_OTHER): Payer: Medicare Other | Admitting: Internal Medicine

## 2023-05-19 VITALS — BP 122/78 | HR 56 | Temp 98.4°F | Resp 18 | Ht 62.0 in | Wt 176.4 lb

## 2023-05-19 DIAGNOSIS — D649 Anemia, unspecified: Secondary | ICD-10-CM | POA: Diagnosis not present

## 2023-05-19 DIAGNOSIS — Z0001 Encounter for general adult medical examination with abnormal findings: Secondary | ICD-10-CM | POA: Diagnosis not present

## 2023-05-19 DIAGNOSIS — I10 Essential (primary) hypertension: Secondary | ICD-10-CM

## 2023-05-19 DIAGNOSIS — R7989 Other specified abnormal findings of blood chemistry: Secondary | ICD-10-CM | POA: Diagnosis not present

## 2023-05-19 DIAGNOSIS — E119 Type 2 diabetes mellitus without complications: Secondary | ICD-10-CM

## 2023-05-19 DIAGNOSIS — E785 Hyperlipidemia, unspecified: Secondary | ICD-10-CM | POA: Diagnosis not present

## 2023-05-19 DIAGNOSIS — Z0000 Encounter for general adult medical examination without abnormal findings: Secondary | ICD-10-CM

## 2023-05-19 LAB — CBC WITH DIFFERENTIAL/PLATELET
Basophils Absolute: 0 10*3/uL (ref 0.0–0.1)
Basophils Relative: 0.3 % (ref 0.0–3.0)
Eosinophils Absolute: 0 10*3/uL (ref 0.0–0.7)
Eosinophils Relative: 0.4 % (ref 0.0–5.0)
HCT: 40.6 % (ref 39.0–52.0)
Hemoglobin: 13.1 g/dL (ref 13.0–17.0)
Lymphocytes Relative: 37.5 % (ref 12.0–46.0)
Lymphs Abs: 2.6 10*3/uL (ref 0.7–4.0)
MCHC: 32.2 g/dL (ref 30.0–36.0)
MCV: 86.8 fl (ref 78.0–100.0)
Monocytes Absolute: 0.5 10*3/uL (ref 0.1–1.0)
Monocytes Relative: 6.7 % (ref 3.0–12.0)
Neutro Abs: 3.8 10*3/uL (ref 1.4–7.7)
Neutrophils Relative %: 55.1 % (ref 43.0–77.0)
Platelets: 257 10*3/uL (ref 150.0–400.0)
RBC: 4.68 Mil/uL (ref 4.22–5.81)
RDW: 15.7 % — ABNORMAL HIGH (ref 11.5–15.5)
WBC: 6.8 10*3/uL (ref 4.0–10.5)

## 2023-05-19 LAB — FERRITIN: Ferritin: 42.1 ng/mL (ref 22.0–322.0)

## 2023-05-19 LAB — LDL CHOLESTEROL, DIRECT: Direct LDL: 150 mg/dL

## 2023-05-19 LAB — ALT: ALT: 17 U/L (ref 0–53)

## 2023-05-19 LAB — TSH: TSH: 3.24 u[IU]/mL (ref 0.35–5.50)

## 2023-05-19 LAB — LIPID PANEL
Cholesterol: 223 mg/dL — ABNORMAL HIGH (ref 0–200)
HDL: 48.7 mg/dL (ref >39.00)
NonHDL: 174.48
Total CHOL/HDL Ratio: 5
Triglycerides: 222 mg/dL — ABNORMAL HIGH (ref 0.0–149.0)
VLDL: 44.4 mg/dL — ABNORMAL HIGH (ref 0.0–40.0)

## 2023-05-19 LAB — IRON: Iron: 72 ug/dL (ref 42–165)

## 2023-05-19 LAB — HEMOGLOBIN A1C: Hgb A1c MFr Bld: 6.3 % (ref 4.6–6.5)

## 2023-05-19 LAB — AST: AST: 21 U/L (ref 0–37)

## 2023-05-19 NOTE — Progress Notes (Signed)
Subjective:    Patient ID: Christopher Guerra, male    DOB: 02/15/1956, 67 y.o.   MRN: [id number]  DOS:  05/19/2023 Type of visit - description: cpx Here for CPX.  In general feels well and has no major concerns Chronic medical problems addressed.  Review of Systems   A 14 point review of systems is negative    Past Medical History:  Diagnosis Date   Diabetes mellitus without complication (HCC)    Elevated PSA    Gout    ? of gout   Tinnitus     Past Surgical History:  Procedure Laterality Date   CATARACT EXTRACTION Bilateral 2015   Social History   Socioeconomic History   Marital status: Married    Spouse name: Not on file   Number of children: 3   Years of education: Not on file   Highest education level: Not on file  Occupational History   Occupation: retired  Tobacco Use   Smoking status: Former   Smokeless tobacco: Never   Tobacco comments:    Remote , minimal use, quit remotely   Substance and Sexual Activity   Alcohol use: Yes    Comment: socially    Drug use: No   Sexual activity: Not on file  Other Topics Concern   Not on file  Social History Narrative   Original from [place], 3 daughters, wife has an additional child, 2 adopted children    Lives w/ wife, daughter, g-daughter    Social Determinants of Health   Financial Resource Strain: Not on file  Food Insecurity: Not on file  Transportation Needs: Not on file  Physical Activity: Not on file  Stress: Not on file  Social Connections: Not on file  Intimate Partner Violence: Not on file     Current Outpatient Medications  Medication Instructions   amLODipine (NORVASC) 5 MG tablet TAKE 1 TABLET(5 MG) BY MOUTH DAILY   tamsulosin (FLOMAX) 0.4 mg, Oral       Objective:   Physical Exam BP 122/78 (BP Location: Left Arm, Patient Position: Sitting, Cuff Size: Normal)   Pulse (!) 56   Temp 98.4 F (36.9 C) (Temporal)   Resp 18   Ht 5\' 2"  (1.575 m)   Wt 176 lb 6.4 oz (80 kg)    SpO2 97%   BMI 32.26 kg/m  General: Well developed, NAD, BMI noted Neck: No  thyromegaly  HEENT:  Normocephalic . Face symmetric, atraumatic Lungs:  CTA B Normal respiratory effort, no intercostal retractions, no accessory muscle use. Heart: RRR,  no murmur.  Abdomen:  Not distended, soft, non-tender. No rebound or rigidity.   Lower extremities: no pretibial edema bilaterally  Skin: Exposed areas without rash. Not pale. Not jaundice Neurologic:  alert & oriented X3.  Speech normal, gait appropriate for age and unassisted Strength symmetric and appropriate for age.  Psych: Cognition and judgment appear intact.  Cooperative with normal attention span and concentration.  Behavior appropriate. No anxious or depressed appearing.     Assessment     Assessment DM: A1c 6.5   (2013) HTN Hyperlipidemia H/o Gout  H/o  Mild Tinnitus , reports he saw ENT before  Cataract surgery B PSA elevated   PLAN: Here for CPX Td 2012  PNM 13: 01/2021,   PNM 20: 01/2022  Vaccines I recommend: Tdap, RSV, covid booster, flu shot  (fall)  Prostate cancer screening: per urology , PSA was 3.0 (may 2024- [facility])   Cscope 10-2012 neg, 10 years,  due , rec to call GI, contact information provided. Healthcare POA: Discussed with patient DM: Diet controlled, check A1c. Hyperlipidemia: Has been reluctant to take statins but will proceed if needed.  Check FLP HTN: On amlodipine, BP very good, ambulatory BPs in the 130s.  No change.  Labs. Increased LFTs: Recent increased LFTs in the context of a viral syndrome, recheck AST ALT and hepatitis serology. Mild anemia: Recheck a CBC, iron and ferritin.  No GI sxs, due for colonoscopy, pt aware. Instructions printed in English and discussed in Spanish, he verbalized understanding. RTC 4 months

## 2023-05-19 NOTE — Patient Instructions (Addendum)
Please call gastroenterology for your colonoscopy: [phone]  Vaccines I recommend: Tdap, RSV, covid booster, flu shot  (fall)   Check the  blood pressure regularly BP GOAL is between 110/65 and  135/85. If it is consistently higher or lower, let me know     GO TO THE LAB : Get the blood work     GO TO THE FRONT DESK, PLEASE SCHEDULE YOUR APPOINTMENTS Come back for checkup in 4 months      "Health Care Power of attorney" ,  "Living will" (Advance care planning documents)  If you already have a living will or healthcare power of attorney, is recommended you bring the copy to be scanned in your chart.   The document will be available to all the doctors you see in the system.  Advance care planning is a process that supports adults in  understanding and sharing their preferences regarding future medical care.  The patient's preferences are recorded in documents called Advance Directives and the can be modified at any time while the patient is in full mental capacity.   If you don't have one, please consider create one.      More information at: [email or web address]

## 2023-05-20 ENCOUNTER — Encounter: Payer: Self-pay | Admitting: Internal Medicine

## 2023-05-20 NOTE — Assessment & Plan Note (Signed)
Here for CPX DM: Diet controlled, check A1c. Hyperlipidemia: Has been reluctant to take statins but will proceed if needed.  Check FLP HTN: On amlodipine, BP very good, ambulatory BPs in the 130s.  No change.  Labs. Increased LFTs: Recent increased LFTs in the context of a viral syndrome, recheck AST ALT and hepatitis serology. Mild anemia: Recheck a CBC, iron and ferritin.  No GI sxs, due for colonoscopy, pt aware. Instructions printed in English and discussed in Spanish, he verbalized understanding. RTC 4 months

## 2023-05-20 NOTE — Assessment & Plan Note (Signed)
Here for CPX Td 2012  PNM 13: 01/2021,   PNM 20: 01/2022  Vaccines I recommend: Tdap, RSV, covid booster, flu shot  (fall)  Prostate cancer screening: per urology , PSA was 3.0 (may 2024- [facility])   Cscope 10-2012 neg, 10 years, due , rec to call GI, contact information provided. Healthcare POA: Discussed with patient

## 2023-05-22 ENCOUNTER — Other Ambulatory Visit: Payer: Self-pay

## 2023-05-22 DIAGNOSIS — E785 Hyperlipidemia, unspecified: Secondary | ICD-10-CM

## 2023-05-22 MED ORDER — AMLODIPINE BESYLATE 5 MG PO TABS: ORAL_TABLET | ORAL | 3 refills | Status: AC

## 2023-05-22 MED ORDER — ATORVASTATIN CALCIUM 40 MG PO TABS: 40.0000 mg | ORAL_TABLET | Freq: Every day | ORAL | 1 refills | Status: DC

## 2023-05-22 NOTE — Addendum Note (Signed)
Addended by: Wilford Corner on: 05/22/2023 04:03 PM   Modules accepted: Orders

## 2023-06-23 ENCOUNTER — Telehealth: Payer: Self-pay | Admitting: Internal Medicine

## 2023-06-23 NOTE — Telephone Encounter (Signed)
Copied from CRM [id number]. Topic: Medicare AWV >> Jun 23, 2023 10:09 AM Payton Doughty wrote: Reason for CRM: Called 06/23/2023 to sched AWV - MAILBOX FULL  Verlee Rossetti; Care Guide Ambulatory Clinical Support [facility] [facility] Direct Dial: [phone]

## 2023-07-20 ENCOUNTER — Other Ambulatory Visit: Payer: Medicare Other

## 2023-07-28 ENCOUNTER — Other Ambulatory Visit (INDEPENDENT_AMBULATORY_CARE_PROVIDER_SITE_OTHER): Payer: Medicare Other

## 2023-07-28 DIAGNOSIS — E785 Hyperlipidemia, unspecified: Secondary | ICD-10-CM

## 2023-07-28 LAB — LIPID PANEL
Cholesterol: 153 mg/dL (ref 0–200)
HDL: 51.8 mg/dL (ref >39.00)
LDL Cholesterol: 78 mg/dL (ref 0–99)
NonHDL: 101.08
Total CHOL/HDL Ratio: 3
Triglycerides: 113 mg/dL (ref 0.0–149.0)
VLDL: 22.6 mg/dL (ref 0.0–40.0)

## 2023-07-28 LAB — AST: AST: 23 U/L (ref 0–37)

## 2023-07-28 LAB — ALT: ALT: 20 U/L (ref 0–53)

## 2023-08-07 DIAGNOSIS — H04123 Dry eye syndrome of bilateral lacrimal glands: Secondary | ICD-10-CM | POA: Diagnosis not present

## 2023-08-07 DIAGNOSIS — H43812 Vitreous degeneration, left eye: Secondary | ICD-10-CM | POA: Diagnosis not present

## 2023-10-15 ENCOUNTER — Other Ambulatory Visit: Payer: Self-pay | Admitting: Internal Medicine

## 2023-10-23 DIAGNOSIS — H43813 Vitreous degeneration, bilateral: Secondary | ICD-10-CM | POA: Diagnosis not present

## 2023-10-23 DIAGNOSIS — H26493 Other secondary cataract, bilateral: Secondary | ICD-10-CM | POA: Diagnosis not present

## 2023-10-23 DIAGNOSIS — H5213 Myopia, bilateral: Secondary | ICD-10-CM | POA: Diagnosis not present

## 2023-10-23 DIAGNOSIS — H04123 Dry eye syndrome of bilateral lacrimal glands: Secondary | ICD-10-CM | POA: Diagnosis not present

## 2023-10-23 DIAGNOSIS — H524 Presbyopia: Secondary | ICD-10-CM | POA: Diagnosis not present

## 2023-10-30 ENCOUNTER — Encounter: Payer: Self-pay | Admitting: Internal Medicine

## 2023-10-30 ENCOUNTER — Ambulatory Visit (INDEPENDENT_AMBULATORY_CARE_PROVIDER_SITE_OTHER): Payer: Medicare HMO | Admitting: Internal Medicine

## 2023-10-30 VITALS — BP 126/64 | HR 96 | Temp 97.6°F | Resp 16 | Ht 62.0 in | Wt 185.5 lb

## 2023-10-30 DIAGNOSIS — E119 Type 2 diabetes mellitus without complications: Secondary | ICD-10-CM

## 2023-10-30 DIAGNOSIS — E785 Hyperlipidemia, unspecified: Secondary | ICD-10-CM

## 2023-10-30 DIAGNOSIS — I10 Essential (primary) hypertension: Secondary | ICD-10-CM

## 2023-10-30 DIAGNOSIS — R7989 Other specified abnormal findings of blood chemistry: Secondary | ICD-10-CM | POA: Diagnosis not present

## 2023-10-30 LAB — MICROALBUMIN / CREATININE URINE RATIO
Creatinine,U: 130.7 mg/dL
Microalb Creat Ratio: 0.7 mg/g (ref 0.0–30.0)
Microalb, Ur: 0.9 mg/dL (ref 0.0–1.9)

## 2023-10-30 LAB — BASIC METABOLIC PANEL
BUN: 16 mg/dL (ref 6–23)
CO2: 28 meq/L (ref 19–32)
Calcium: 9.2 mg/dL (ref 8.4–10.5)
Chloride: 101 meq/L (ref 96–112)
Creatinine, Ser: 1.09 mg/dL (ref 0.40–1.50)
GFR: 69.99 mL/min (ref >60.00)
Glucose, Bld: 109 mg/dL — ABNORMAL HIGH (ref 70–99)
Potassium: 4.5 meq/L (ref 3.5–5.1)
Sodium: 139 meq/L (ref 135–145)

## 2023-10-30 LAB — ALT: ALT: 24 U/L (ref 0–53)

## 2023-10-30 LAB — AST: AST: 24 U/L (ref 0–37)

## 2023-10-30 LAB — HEMOGLOBIN A1C: Hgb A1c MFr Bld: 6.9 % — ABNORMAL HIGH (ref 4.6–6.5)

## 2023-10-30 NOTE — Assessment & Plan Note (Signed)
Diabetes: A1c decreased from 6.9 to 6.3 few months ago.  At this point she has gained weight and is not eating healthy.  Feet exam negative. Plan: Recheck A1c, micro.  Consider medication noted that [place] 2 [place].  Strongly recommend to improve his diet, not sure if he is inclined to do that HTN: On amlodipine 5 mg, ambulatory BPs 130, 140.  Would like to see 10 points lower.  Recommend a low-salt diet. High cholesterol: Last FLP elevated,  atorvastatin increased to 40 mg, follow-up FLP very good.  Check AST ALT Increased LFTs: Few months ago hepatitis B and C serology negative, iron ferritin normal Preventive care: Advise regarding flu and COVID-vaccine, patient reluctant, emphasized the benefits. RTC 3 months

## 2023-10-30 NOTE — Patient Instructions (Addendum)
Vaccines I recommend: Covid booster Shingrix (shingles)   Check the  blood pressure regularly Blood pressure goal:  between 110/65 and  135/85. If it is consistently higher or lower, let me know   Evite comer mucha sal   GO TO THE LAB : Get the blood work     Next visit with me 3 months    Please schedule it at the front desk        Aparentemente ya es tiempo de hacerse un examen de los ojos. Por favor llame a su doctor de los ojos (ophthalmologist, optometrist) y saque una cita. Solicite que nos envien una copia de la visita a nuestro fax: [phone]. Si necesitara un "referral" nosotros lo podemos hacer

## 2023-10-30 NOTE — Progress Notes (Signed)
   Subjective:    Patient ID: Christopher Guerra, male    DOB: [birth year], 68 y.o.   MRN: [id number]  DOS:  10/30/2023 Type of visit - description: Follow-up  Since the last office visit is doing well. He checks ambulatory BPs regularly. + Weight gain noted, diet has not been healthy lately. Denies lower extremity paresthesias  Wt Readings from Last 3 Encounters:  10/30/23 185 lb 8 oz (84.1 kg)  05/19/23 176 lb 6.4 oz (80 kg)  02/02/23 174 lb 8 oz (79.2 kg)   Review of Systems See above   Past Medical History:  Diagnosis Date   Diabetes mellitus without complication (HCC)    Elevated PSA    Gout    ? of gout   Tinnitus     Past Surgical History:  Procedure Laterality Date   CATARACT EXTRACTION Bilateral 2015    Current Outpatient Medications  Medication Instructions   amLODipine (NORVASC) 5 MG tablet TAKE 1 TABLET(5 MG) BY MOUTH DAILY   atorvastatin (LIPITOR) 40 mg, Oral, Daily   tamsulosin (FLOMAX) 0.4 mg       Objective:   Physical Exam BP 126/64   Pulse 96   Temp 97.6 F (36.4 C) (Oral)   Resp 16   Ht 5\' 2"  (1.575 m)   Wt 185 lb 8 oz (84.1 kg)   SpO2 98%   BMI 33.93 kg/m  General:   Well developed, NAD, BMI noted. HEENT:  Normocephalic . Face symmetric, atraumatic Lungs:  CTA B Normal respiratory effort, no intercostal retractions, no accessory muscle use. Heart: RRR,  no murmur.  DM foot exam: No edema, good pedal pulses, pinprick examination normal Skin: Not pale. Not jaundice Neurologic:  alert & oriented X3.  Speech normal, gait appropriate for age and unassisted Psych--  Cognition and judgment appear intact.  Cooperative with normal attention span and concentration.  Behavior appropriate. No anxious or depressed appearing.      Assessment   Assessment DM: A1c 6.5   (2013) HTN Hyperlipidemia H/o Gout  H/o  Mild Tinnitus , reports he saw ENT before  Cataract surgery B PSA elevated  Elevated LFTs: Hepatitis B-C, ferritin WNL  2024  PLAN: Diabetes: A1c decreased from 6.9 to 6.3 few months ago.  At this point she has gained weight and is not eating healthy.  Feet exam negative. Plan: Recheck A1c, micro.  Consider medication noted that the [place] will be [place] the [place] the next 2 months.  Strongly recommend to improve his diet, not sure if he is inclined to do that HTN: On amlodipine 5 mg, ambulatory BPs 130, 140.  Would like to see 10 points lower.  Recommend a low-salt diet. High cholesterol: Last FLP elevated,  atorvastatin increased to 40 mg, follow-up FLP very good.  Check AST ALT Increased LFTs: Few months ago hepatitis B and C serology negative, iron ferritin normal Preventive care: Advise regarding flu and COVID-vaccine, patient reluctant, emphasized the benefits. RTC 3 months

## 2023-11-02 ENCOUNTER — Encounter: Payer: Self-pay | Admitting: Internal Medicine

## 2023-11-02 LAB — HM DIABETES EYE EXAM

## 2024-01-19 ENCOUNTER — Other Ambulatory Visit: Payer: Self-pay | Admitting: Internal Medicine

## 2024-01-20 ENCOUNTER — Encounter: Payer: Self-pay | Admitting: Internal Medicine

## 2024-01-21 ENCOUNTER — Encounter: Payer: Self-pay | Admitting: Internal Medicine

## 2024-03-02 ENCOUNTER — Other Ambulatory Visit: Payer: Self-pay | Admitting: Internal Medicine

## 2024-03-08 DIAGNOSIS — R972 Elevated prostate specific antigen [PSA]: Secondary | ICD-10-CM | POA: Diagnosis not present

## 2024-03-08 LAB — PSA: PSA: 4.51

## 2024-03-09 DIAGNOSIS — N5201 Erectile dysfunction due to arterial insufficiency: Secondary | ICD-10-CM | POA: Diagnosis not present

## 2024-03-09 DIAGNOSIS — R972 Elevated prostate specific antigen [PSA]: Secondary | ICD-10-CM | POA: Diagnosis not present

## 2024-03-09 DIAGNOSIS — R3914 Feeling of incomplete bladder emptying: Secondary | ICD-10-CM | POA: Diagnosis not present

## 2024-03-10 ENCOUNTER — Encounter: Payer: Self-pay | Admitting: Internal Medicine

## 2024-05-04 ENCOUNTER — Ambulatory Visit: Admitting: Internal Medicine

## 2024-05-04 ENCOUNTER — Ambulatory Visit: Payer: Self-pay | Admitting: Internal Medicine

## 2024-05-04 ENCOUNTER — Encounter: Payer: Self-pay | Admitting: Internal Medicine

## 2024-05-04 VITALS — BP 136/84 | HR 53 | Temp 97.7°F | Resp 16 | Ht 62.0 in | Wt 175.5 lb

## 2024-05-04 DIAGNOSIS — E785 Hyperlipidemia, unspecified: Secondary | ICD-10-CM

## 2024-05-04 DIAGNOSIS — I10 Essential (primary) hypertension: Secondary | ICD-10-CM | POA: Diagnosis not present

## 2024-05-04 DIAGNOSIS — E119 Type 2 diabetes mellitus without complications: Secondary | ICD-10-CM | POA: Diagnosis not present

## 2024-05-04 DIAGNOSIS — M25561 Pain in right knee: Secondary | ICD-10-CM

## 2024-05-04 DIAGNOSIS — M109 Gout, unspecified: Secondary | ICD-10-CM | POA: Diagnosis not present

## 2024-05-04 DIAGNOSIS — M25461 Effusion, right knee: Secondary | ICD-10-CM | POA: Diagnosis not present

## 2024-05-04 DIAGNOSIS — Z2821 Immunization not carried out because of patient refusal: Secondary | ICD-10-CM

## 2024-05-04 LAB — BASIC METABOLIC PANEL WITH GFR
BUN: 26 mg/dL — ABNORMAL HIGH (ref 6–23)
CO2: 23 meq/L (ref 19–32)
Calcium: 8.9 mg/dL (ref 8.4–10.5)
Chloride: 103 meq/L (ref 96–112)
Creatinine, Ser: 1.18 mg/dL (ref 0.40–1.50)
GFR: 63.4 mL/min (ref >60.00)
Glucose, Bld: 102 mg/dL — ABNORMAL HIGH (ref 70–99)
Potassium: 4.2 meq/L (ref 3.5–5.1)
Sodium: 136 meq/L (ref 135–145)

## 2024-05-04 LAB — MICROALBUMIN / CREATININE URINE RATIO
Creatinine,U: 108.7 mg/dL
Microalb Creat Ratio: 11.6 mg/g (ref 0.0–30.0)
Microalb, Ur: 1.3 mg/dL (ref 0.0–1.9)

## 2024-05-04 LAB — HEMOGLOBIN A1C: Hgb A1c MFr Bld: 6.7 % — ABNORMAL HIGH (ref 4.6–6.5)

## 2024-05-04 LAB — URIC ACID: Uric Acid, Serum: 7.9 mg/dL — ABNORMAL HIGH (ref 4.0–7.8)

## 2024-05-04 NOTE — Patient Instructions (Signed)
 CHEQUEAR LA PRESION FRECUENTEMENTE , DEBE SER MENOS DE 135/85  CUIDE SU DIETA    LO ESTAMOS REFIERIENDO A SPORTS MEDICINE PARA QUE VEAN SU RODILLA   VAYA AL LABORATORIO PARA UNA MUESTRA DE SANGRE Y ORINA  REGRESE PARA SU EXAMEN FISICO EN 3 O 4 MESES      Continue checking your blood pressure regularly Blood pressure goal:  between 110/65 and  135/85. If it is consistently higher or lower, let me know  Watch your diet closely  Will refer you to sports medicine   GO TO THE LAB :  Get the blood work   Your results will be posted on MyChart with my comments  Next office visit for a physical exam in 3 to 4 months Please make an appointment before you leave today

## 2024-05-04 NOTE — Assessment & Plan Note (Signed)
 DM: Last A1c increased from 6.3 to 6.9.  Since then he has improved his diet, has lost some weight.  No ambulatory CBGs. I explained the patient what A1c is, encouraged to continue eating healthier, check A1c and micro. HTN: On amlodipine , ambulatory BPs is mostly in the 130s, occasionally 10 points up or down. Plan: No change, check BMP. Knee effusion: After painting a room and doing a lot of moving develop right knee pain and effusion on exam.  He has a history of gout, the joint is not warm, no history of fever or chills.  He does have mild limping. Suspect DJD exacerbation, referred to sports medicine for possible tap.  For completeness we will check a uric acid. Increased PSA.  LOV urology 03/09/2024.  PSA went up to 4.5.  Prostate was slightly tender, was Rx NSAIDs  they plan to check a PSA 05-2024. Diastasis recti: Patient concerned about a bulge of the abdomen, has it  for a long time ; has a diastases recti.  We agreed on observation unless he has pain or the area gets worse Vaccine advised: Declines vaccines today RTC 3 to 4 months CPX

## 2024-05-04 NOTE — Progress Notes (Signed)
 Subjective:    Patient ID: Christopher Guerra, male    DOB: Jun 03, 1956, 68 y.o.   MRN: [id number]  DOS:  05/04/2024 Type of visit - description: Routine checkup  Chronic medical problems addressed.  Saw urology, notes reviewed. Just finishing several weeks of NSAIDs as prescribed by urology.  Denies nausea or vomiting.  No diarrhea, no change in the color of the stools.  Did some painting at home, going up and down ladders. Developed left hip pain with some radiation to the leg, that is largely resolved. Also  developed pain and swelling on the right knee.  Overall slightly better but not completely gone.  No fever or chills.  : Wt Readings from Last 3 Encounters:  05/04/24 175 lb 8 oz (79.6 kg)  10/30/23 185 lb 8 oz (84.1 kg)  05/19/23 176 lb 6.4 oz (80 kg)      Review of Systems See above   Past Medical History:  Diagnosis Date   Diabetes mellitus without complication (HCC)    Elevated PSA    Gout    ? of gout   Tinnitus     Past Surgical History:  Procedure Laterality Date   CATARACT EXTRACTION Bilateral 2015    Current Outpatient Medications  Medication Instructions   amLODipine  (NORVASC ) 5 MG tablet TAKE 1 TABLET(5 MG) BY MOUTH DAILY   atorvastatin  (LIPITOR) 40 mg, Oral, Daily   tamsulosin (FLOMAX) 0.4 mg       Objective:   Physical Exam BP 136/84   Pulse (!) 53   Temp 97.7 F (36.5 C) (Oral)   Resp 16   Ht 5' 2 (1.575 m)   Wt 175 lb 8 oz (79.6 kg)   SpO2 97%   BMI 32.10 kg/m  General:   Well developed, NAD, BMI noted. HEENT:  Normocephalic . Face symmetric, atraumatic Lungs:  CTA B Normal respiratory effort, no intercostal retractions, no accessory muscle use. Heart: RRR,  no murmur. Abdomen: Diastases recti noted Lower extremities: Hip ROM normal Not tender at the trochanteric bursas L knee: Normal R knee: Has a effusion, ROM slightly decreased, unable to flex the knee completely. The knee is not warm or red. Skin: Not pale. Not  jaundice Neurologic:  alert & oriented X3.  Speech normal, gait appropriate for age and unassisted.  Very mild limping due to knee pain Psych--  Cognition and judgment appear intact.  Cooperative with normal attention span and concentration.  Behavior appropriate. No anxious or depressed appearing.      Assessment     Assessment DM: A1c 6.5   (2013) HTN Hyperlipidemia H/o Gout  H/o  Mild Tinnitus , reports he saw ENT before  Cataract surgery B PSA elevated  Elevated LFTs: Hepatitis B-C, ferritin WNL 2024  PLAN: DM: Last A1c increased from 6.3 to 6.9.  Since then he has improved his diet, has lost some weight.  No ambulatory CBGs. I explained the patient what A1c is, encouraged to continue eating healthier, check A1c and micro. HTN: On amlodipine , ambulatory BPs is mostly in the 130s, occasionally 10 points up or down. Plan: No change, check BMP. Knee effusion: After painting a room and doing a lot of moving develop right knee pain and effusion on exam.  He has a history of gout, the joint is not warm, no history of fever or chills.  He does have mild limping. Suspect DJD exacerbation, referred to sports medicine for possible tap.  For completeness we will check a uric acid. Increased  PSA.  LOV urology 03/09/2024.  PSA went up to 4.5.  Prostate was slightly tender, was Rx NSAIDs  they plan to check a PSA 05-2024. Diastasis recti: Patient concerned about a bulge of the abdomen, has it  for a long time ; has a diastases recti.  We agreed on observation unless he has pain or the area gets worse Vaccine advised: Declines vaccines today RTC 3 to 4 months CPX

## 2024-05-10 ENCOUNTER — Ambulatory Visit: Admitting: Internal Medicine

## 2024-05-10 NOTE — Progress Notes (Unsigned)
 [name], PhD, LAT, ATC acting as a scribe for [name], MD.  Christopher Guerra is a 68 y.o. male who presents to [facility] today for R knee pain x 1-wk. Pain started when he was doing some painting at home, going up/down the ladder. Pt locates pain to all over the knee joint. +swelling  Of note, pt has an upcoming trip to [place], his home country, leaving 8/7.  He will be returning around September 20.  Previously he was prescribed allopurinol in [place] for gout but is no longer taking it.  He does have a prescription for meloxicam  that he has used occasionally which has helped various pains in the past.  Dx testing: 05/04/24 Labs (uric acid 7.9)  Pertinent review of systems: No fevers or chills  Relevant historical information: Diabetes and gout   Exam:  BP (!) 142/90   Pulse (!) 57   Ht 5' 2 (1.575 m)   Wt 175 lb (79.4 kg)   SpO2 96%   BMI 32.01 kg/m  General: Well Developed, well nourished, and in no acute distress.   MSK: Right knee moderate effusion decreased range of motion nontender to palpation intact strength.    Lab and Radiology Results  Procedure: Real-time Ultrasound Guided Injection of right knee joint superior lateral patella space Device: Philips Affiniti 50G/GE Logiq Images permanently stored and available for review in PACS Verbal informed consent obtained.  Discussed risks and benefits of procedure. Warned about infection, bleeding, hyperglycemia damage to structures among others. Patient expresses understanding and agreement Time-out conducted.   Noted no overlying erythema, induration, or other signs of local infection.   Skin prepped in a sterile fashion.   Local anesthesia: Topical Ethyl chloride.   With sterile technique and under real time ultrasound guidance: 40 mg of Kenalog and 2 mL of Marcaine injected into knee joint. Fluid seen entering the joint capsule.   Completed without difficulty    Pain immediately resolved suggesting accurate placement of the medication.   Advised to call if fevers/chills, erythema, induration, drainage, or persistent bleeding.   Images permanently stored and available for review in the ultrasound unit.  Impression: Technically successful ultrasound guided injection.    X-ray images right knee obtained today personally and independently interpreted. Mild medial and patellofemoral DJD.  No acute fractures. Await formal radiology review  Lab Results  Component Value Date   LABURIC 7.9 (H) 05/04/2024      Assessment and Plan: 68 y.o. 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  He has increased his activity level recently which caused a flareup in pain.  He does have DJD on x-ray.  Plan for steroid injection and meloxicam  and Voltaren gel.  Gout is definitely a possibility as well.  Uric acid is elevated at 7.9 when last checked.  I would like to get his gout a bit under control.  However that would require adjusting allopurinol and using a prophylaxis medicine.  Typically this would be colchicine  but he is taking both amlodipine  and atorvastatin  both of which could interfere with colchicine  and should not be used.  Plan to delay more specific long-term gout care until he returns from [place] around September 20.  Check back with me then and we can spend more time in gout.   PDMP not reviewed this encounter. Orders Placed This Encounter  Procedures   US  LIMITED JOINT SPACE STRUCTURES LOW RIGHT(NO LINKED CHARGES)    Reason for Exam (SYMPTOM  OR DIAGNOSIS REQUIRED):   right knee pain    Preferred imaging location?:   [facility] Knee AP/LAT W/Sunrise Right    Standing Status:   Future    Number of Occurrences:   1    Expiration Date:   06/11/2024    Reason for Exam (SYMPTOM  OR DIAGNOSIS REQUIRED):   right knee pain    Preferred imaging location?:   [facility]   Meds ordered this encounter   Medications   meloxicam  (MOBIC ) 15 MG tablet    Sig: Take 1 tablet (15 mg total) by mouth daily as needed for pain (knee pain).    Dispense:  30 tablet    Refill:  0     Discussed warning signs or symptoms. Please see discharge instructions. Patient expresses understanding.   The above documentation has been reviewed and is accurate and complete [name], M.D.

## 2024-05-11 ENCOUNTER — Ambulatory Visit (INDEPENDENT_AMBULATORY_CARE_PROVIDER_SITE_OTHER)

## 2024-05-11 ENCOUNTER — Ambulatory Visit: Admitting: Family Medicine

## 2024-05-11 ENCOUNTER — Other Ambulatory Visit: Payer: Self-pay

## 2024-05-11 VITALS — BP 142/90 | HR 57 | Ht 62.0 in | Wt 175.0 lb

## 2024-05-11 DIAGNOSIS — M25561 Pain in right knee: Secondary | ICD-10-CM | POA: Diagnosis not present

## 2024-05-11 DIAGNOSIS — G8929 Other chronic pain: Secondary | ICD-10-CM | POA: Insufficient documentation

## 2024-05-11 DIAGNOSIS — M1A0610 Idiopathic chronic gout, right knee, without tophus (tophi): Secondary | ICD-10-CM

## 2024-05-11 DIAGNOSIS — M1711 Unilateral primary osteoarthritis, right knee: Secondary | ICD-10-CM

## 2024-05-11 DIAGNOSIS — M25461 Effusion, right knee: Secondary | ICD-10-CM | POA: Diagnosis not present

## 2024-05-11 MED ORDER — MELOXICAM 15 MG PO TABS: 15.0000 mg | ORAL_TABLET | Freq: Every day | ORAL | 0 refills | Status: AC | PRN

## 2024-05-11 NOTE — Patient Instructions (Addendum)
 Thank you for coming in today.   You received an injection today. Seek immediate medical attention if the joint becomes red, extremely painful, or is oozing fluid.   Check back when you return from [place]  Please use Voltaren gel (Generic Diclofenac Gel) up to 4x daily for pain as needed.  This is available over-the-counter as both the name brand Voltaren gel and the generic diclofenac gel.

## 2024-05-13 DIAGNOSIS — R972 Elevated prostate specific antigen [PSA]: Secondary | ICD-10-CM | POA: Diagnosis not present

## 2024-05-16 ENCOUNTER — Ambulatory Visit: Payer: Self-pay | Admitting: Family Medicine

## 2024-05-16 NOTE — Progress Notes (Signed)
Right knee x-ray shows a little bit of arthritis.

## 2024-06-28 ENCOUNTER — Other Ambulatory Visit (HOSPITAL_BASED_OUTPATIENT_CLINIC_OR_DEPARTMENT_OTHER): Payer: Self-pay

## 2024-07-03 ENCOUNTER — Other Ambulatory Visit: Payer: Self-pay | Admitting: Internal Medicine

## 2024-07-26 DIAGNOSIS — M1990 Unspecified osteoarthritis, unspecified site: Secondary | ICD-10-CM | POA: Diagnosis not present

## 2024-07-26 DIAGNOSIS — E785 Hyperlipidemia, unspecified: Secondary | ICD-10-CM | POA: Diagnosis not present

## 2024-07-26 DIAGNOSIS — I10 Essential (primary) hypertension: Secondary | ICD-10-CM | POA: Diagnosis not present

## 2024-07-26 DIAGNOSIS — N400 Enlarged prostate without lower urinary tract symptoms: Secondary | ICD-10-CM | POA: Diagnosis not present

## 2024-07-26 DIAGNOSIS — E119 Type 2 diabetes mellitus without complications: Secondary | ICD-10-CM | POA: Diagnosis not present

## 2024-07-26 DIAGNOSIS — M109 Gout, unspecified: Secondary | ICD-10-CM | POA: Diagnosis not present

## 2024-08-05 ENCOUNTER — Encounter: Payer: Self-pay | Admitting: Internal Medicine

## 2024-08-05 ENCOUNTER — Ambulatory Visit (INDEPENDENT_AMBULATORY_CARE_PROVIDER_SITE_OTHER): Admitting: Internal Medicine

## 2024-08-05 VITALS — BP 136/86 | HR 70 | Temp 98.2°F | Resp 16 | Ht 62.0 in | Wt 178.1 lb

## 2024-08-05 DIAGNOSIS — Z0000 Encounter for general adult medical examination without abnormal findings: Secondary | ICD-10-CM

## 2024-08-05 DIAGNOSIS — I10 Essential (primary) hypertension: Secondary | ICD-10-CM | POA: Diagnosis not present

## 2024-08-05 DIAGNOSIS — M1A0610 Idiopathic chronic gout, right knee, without tophus (tophi): Secondary | ICD-10-CM | POA: Diagnosis not present

## 2024-08-05 DIAGNOSIS — Z1211 Encounter for screening for malignant neoplasm of colon: Secondary | ICD-10-CM

## 2024-08-05 DIAGNOSIS — E785 Hyperlipidemia, unspecified: Secondary | ICD-10-CM

## 2024-08-05 DIAGNOSIS — E1169 Type 2 diabetes mellitus with other specified complication: Secondary | ICD-10-CM | POA: Diagnosis not present

## 2024-08-05 NOTE — Patient Instructions (Addendum)
 GO TO THE LAB :  Get the blood work    Then, go to the front desk for the checkout Please make an appointment for a checkup in 4 months Vaccines I recommend: Flu shot COVID booster Tetanus shot   Check the  blood pressure regularly Blood pressure goal:  between 110/65 and  135/85. If it is consistently higher or lower, let me know  Diabetes  You can check your sugars at different times  - early in AM fasting  ( blood sugar goal 70-130) - 2 hours after a meal (blood sugar goal less than 180)    Please read more detailed instructions below YOUR PLAN: TYPE 2 DIABETES MELLITUS WITHOUT COMPLICATIONS: Your blood glucose levels range from 120-140 mg/dL, and you are not currently on medication for diabetes. -We will order an A1C test to assess your long-term blood sugar control. -Continue with a healthy diet and regular exercise. -We will consider medication based on your A1C results.  PRIMARY HYPERTENSION: Your blood pressure is controlled at 130/80 mmHg with Amlodipine  5 mg daily. -Continue taking Amlodipine  5 mg daily. -Keep monitoring your blood pressure at home regularly.  HYPERLIPIDEMIA: Your cholesterol levels are managed with Atorvastatin  40 mg daily. -We will order AST, ALT, and FLP tests to monitor your liver function and lipid levels.  GOUT, UNSPECIFIED SITE: You had a previous episode of gout in your knee that was treated with an injection. -We will order a uric acid test to monitor your condition.

## 2024-08-05 NOTE — Assessment & Plan Note (Signed)
 Here for CPX Td 2012  PNM 13: 01/2021,   PNM 20: 01/2022  Vaccines I recommend: Tdap,  covid booster, flu shot    Prostate cancer screening: per urology  Cscope 10-2012 neg, 10 years.  Overdue for CCS, declines colonoscopy, other options discussed, elected Cologuard. Labs: See orders Lifestyle: Take walks regularly up to 45 minutes, diet is mostly healthy

## 2024-08-05 NOTE — Assessment & Plan Note (Signed)
 Here for CPX   Other issues addressed  DM type II. Blood glucose levels range from 120-140 mg/dL. No current medication. - Order A1C test to assess glycemic control.  Consider meds. - Recommend healthy diet and continue regular exercise. HTN Blood pressure controlled at 130/80 mmHg with Amlodipine  5 mg daily.  No change, recommend ambulatory BP Hyperlipidemia Managed with Atorvastatin  40 mg daily Order AST, ALT, and FLP . Gout,  History of gout, recently had a knee effusion, was told it was gout.  On no meds.  Check uric acid Increased PSA: Per urology, last PSA was 4.4 (05/19/2024) RTC 4 months

## 2024-08-05 NOTE — Progress Notes (Signed)
 Subjective:    Patient ID: Christopher Guerra, male    DOB: [birth year], 68 y.o.   MRN: [id number]  DOS:  08/05/2024 CPX  Discussed the use of AI scribe software for clinical note transcription with the patient, who gave verbal consent to proceed.  History of Present Illness History of dry eyes, sees ophthalmology  Glycemic status - Occasional self-monitoring of blood glucose - Typical blood glucose readings between 120 and 140 mg/dL - Not currently on medication for diabetes  Hypertension and hyperlipidemia - Takes amlodipine  and atorvastatin  40 mg daily - Home blood pressure readings usually in the 130s  Physical activity and weight management - Walks 45 minutes daily, three to four times per week - Diet with fluctuating weight  Constitutional and systemic symptoms - No chest pain, shortness of breath, nausea, vomiting, blood in stool, urinary problems, or burning sensation in feet    Review of Systems  Other than above, a 14 point review of systems is negative     Past Medical History:  Diagnosis Date   Diabetes mellitus without complication (HCC)    Elevated PSA    Gout    ? of gout   Tinnitus     Past Surgical History:  Procedure Laterality Date   CATARACT EXTRACTION Bilateral 2015    Current Outpatient Medications  Medication Instructions   amLODipine  (NORVASC ) 5 MG tablet TAKE 1 TABLET(5 MG) BY MOUTH DAILY   atorvastatin  (LIPITOR) 40 mg, Oral, Daily   meloxicam  (MOBIC ) 15 mg, Oral, Daily PRN   tamsulosin (FLOMAX) 0.4 mg       Objective:   Physical Exam BP 136/86   Pulse 70   Temp 98.2 F (36.8 C) (Oral)   Resp 16   Ht 5' 2 (1.575 m)   Wt 178 lb 2 oz (80.8 kg)   SpO2 95%   BMI 32.58 kg/m  General: Well developed, NAD, BMI noted Neck: No  thyromegaly  HEENT:  Normocephalic . Face symmetric, atraumatic Lungs:  CTA B Normal respiratory effort, no intercostal retractions, no accessory muscle use. Heart: RRR,  no murmur.  Abdomen:   Not distended, soft, non-tender. No rebound or rigidity.   DM foot exam: No edema, pinprick examination normal Skin: Exposed areas without rash. Not pale. Not jaundice Neurologic:  alert & oriented X3.  Speech normal, gait appropriate for age and unassisted Strength symmetric and appropriate for age.  Psych: Cognition and judgment appear intact.  Cooperative with normal attention span and concentration.  Behavior appropriate. No anxious or depressed appearing.     Assessment   Problem list DM: A1c 6.5   (2013) HTN Hyperlipidemia H/o Gout  H/o  Mild Tinnitus , reports he saw ENT before  Cataract surgery B PSA elevated  Elevated LFTs: Hepatitis B-C, ferritin WNL 2024    Assessment & Plan Here for CPX Td 2012  PNM 13: 01/2021,   PNM 20: 01/2022  Vaccines I recommend: Tdap,  covid booster, flu shot    Prostate cancer screening: per urology  Cscope 10-2012 neg, 10 years.  Overdue for CCS, declines colonoscopy, other options discussed, elected Cologuard. Labs: See orders Lifestyle: Take walks regularly up to 45 minutes, diet is mostly healthy  Other issues addressed  DM type II. Blood glucose levels range from 120-140 mg/dL. No current medication. - Order A1C test to assess glycemic control.  Consider meds. - Recommend healthy diet and continue regular exercise. HTN Blood pressure controlled at 130/80 mmHg with Amlodipine  5 mg daily.  No  change, recommend ambulatory BP Hyperlipidemia Managed with Atorvastatin  40 mg daily Order AST, ALT, and FLP . Gout,  History of gout, recently had a knee effusion, was told it was gout.  On no meds.  Check uric acid Increased PSA: Per urology, last PSA was 4.4 (05/19/2024) RTC 4 months

## 2024-08-06 LAB — LIPID PANEL
Cholesterol: 205 mg/dL — ABNORMAL HIGH (ref <200)
HDL: 52 mg/dL (ref >=40)
LDL Cholesterol (Calc): 126 mg/dL — ABNORMAL HIGH
Non-HDL Cholesterol (Calc): 153 mg/dL — ABNORMAL HIGH (ref <130)
Total CHOL/HDL Ratio: 3.9 (calc) (ref <5.0)
Triglycerides: 159 mg/dL — ABNORMAL HIGH (ref <150)

## 2024-08-06 LAB — CBC WITH DIFFERENTIAL/PLATELET
Absolute Lymphocytes: 3057 {cells}/uL (ref 850–3900)
Absolute Monocytes: 498 {cells}/uL (ref 200–950)
Basophils Absolute: 24 {cells}/uL (ref 0–200)
Basophils Relative: 0.3 %
Eosinophils Absolute: 40 {cells}/uL (ref 15–500)
Eosinophils Relative: 0.5 %
HCT: 38.7 % (ref 38.5–50.0)
Hemoglobin: 13 g/dL — ABNORMAL LOW (ref 13.2–17.1)
MCH: 29.1 pg (ref 27.0–33.0)
MCHC: 33.6 g/dL (ref 32.0–36.0)
MCV: 86.6 fL (ref 80.0–100.0)
MPV: 10.1 fL (ref 7.5–12.5)
Monocytes Relative: 6.3 %
Neutro Abs: 4282 {cells}/uL (ref 1500–7800)
Neutrophils Relative %: 54.2 %
Platelets: 248 Thousand/uL (ref 140–400)
RBC: 4.47 Million/uL (ref 4.20–5.80)
RDW: 13.6 % (ref 11.0–15.0)
Total Lymphocyte: 38.7 %
WBC: 7.9 Thousand/uL (ref 3.8–10.8)

## 2024-08-06 LAB — HEMOGLOBIN A1C
Hgb A1c MFr Bld: 6.3 % — ABNORMAL HIGH (ref <5.7)
Mean Plasma Glucose: 134 mg/dL
eAG (mmol/L): 7.4 mmol/L

## 2024-08-06 LAB — AST: AST: 28 U/L (ref 10–35)

## 2024-08-06 LAB — ALT: ALT: 21 U/L (ref 9–46)

## 2024-08-06 LAB — URIC ACID: Uric Acid, Serum: 7.7 mg/dL (ref 4.0–8.0)

## 2024-08-07 ENCOUNTER — Ambulatory Visit: Payer: Self-pay | Admitting: Internal Medicine

## 2024-08-08 MED ORDER — ATORVASTATIN CALCIUM 40 MG PO TABS: 40.0000 mg | ORAL_TABLET | Freq: Every day | ORAL | 3 refills | Status: AC

## 2024-08-15 DIAGNOSIS — Z1211 Encounter for screening for malignant neoplasm of colon: Secondary | ICD-10-CM | POA: Diagnosis not present

## 2024-08-18 DIAGNOSIS — R972 Elevated prostate specific antigen [PSA]: Secondary | ICD-10-CM | POA: Diagnosis not present

## 2024-08-25 LAB — COLOGUARD: COLOGUARD: NEGATIVE

## 2024-08-26 DIAGNOSIS — R972 Elevated prostate specific antigen [PSA]: Secondary | ICD-10-CM | POA: Diagnosis not present

## 2024-08-26 DIAGNOSIS — R399 Unspecified symptoms and signs involving the genitourinary system: Secondary | ICD-10-CM | POA: Diagnosis not present

## 2024-08-26 NOTE — Telephone Encounter (Signed)
 Diabetes is well-controlled.  Hemoglobin is slightly low.  Cholesterol is elevated.  Please have him return for blood work and order a CBC, iron studies, and lipid panel.  Cologuard is negative, we will recheck routinely in 3 years.

## 2024-09-20 ENCOUNTER — Other Ambulatory Visit: Payer: Self-pay | Admitting: Urology

## 2024-09-20 DIAGNOSIS — R972 Elevated prostate specific antigen [PSA]: Secondary | ICD-10-CM

## 2024-10-24 ENCOUNTER — Encounter: Payer: Self-pay | Admitting: Urology

## 2024-11-01 ENCOUNTER — Ambulatory Visit
Admission: RE | Admit: 2024-11-01 | Discharge: 2024-11-01 | Disposition: A | Source: Ambulatory Visit | Attending: Urology

## 2024-11-01 DIAGNOSIS — R972 Elevated prostate specific antigen [PSA]: Secondary | ICD-10-CM

## 2024-11-01 MED ORDER — GADOPICLENOL 0.5 MMOL/ML IV SOLN
8.0000 mL | Freq: Once | INTRAVENOUS | Status: AC | PRN
  Administered 2024-11-01: 8 mL via INTRAVENOUS

## 2025-02-07 ENCOUNTER — Ambulatory Visit: Admitting: Internal Medicine

## 2025-02-07 ENCOUNTER — Ambulatory Visit

## 2025-02-10 ENCOUNTER — Ambulatory Visit: Admitting: Internal Medicine

## 2025-02-10 ENCOUNTER — Ambulatory Visit
